# Patient Record
Sex: Female | Born: 2005 | Race: White | Hispanic: No | Marital: Single | Smoking: Never smoker
Health system: Southern US, Community
[De-identification: ages and names within clinical notes are randomized; demographics above are authoritative.]

## PROBLEM LIST (undated history)

## (undated) ENCOUNTER — Inpatient Hospital Stay (HOSPITAL_COMMUNITY): Payer: Self-pay

## (undated) ENCOUNTER — Emergency Department (HOSPITAL_COMMUNITY): Admission: EM

## (undated) DIAGNOSIS — F419 Anxiety disorder, unspecified: Secondary | ICD-10-CM

## (undated) DIAGNOSIS — O2340 Unspecified infection of urinary tract in pregnancy, unspecified trimester: Secondary | ICD-10-CM

## (undated) HISTORY — PX: NO PAST SURGERIES: SHX2092

---
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
## 2024-02-07 ENCOUNTER — Encounter (HOSPITAL_COMMUNITY): Payer: Self-pay | Admitting: Obstetrics and Gynecology

## 2024-02-07 ENCOUNTER — Inpatient Hospital Stay (HOSPITAL_COMMUNITY)
Admission: AD | Admit: 2024-02-07 | Discharge: 2024-02-07 | Disposition: A | Discharge status: Home / Self Care | Attending: Obstetrics and Gynecology | Admitting: Obstetrics and Gynecology

## 2024-02-07 ENCOUNTER — Inpatient Hospital Stay (HOSPITAL_COMMUNITY)

## 2024-02-07 DIAGNOSIS — Z362 Encounter for other antenatal screening follow-up: Secondary | ICD-10-CM | POA: Insufficient documentation

## 2024-02-07 DIAGNOSIS — R519 Headache, unspecified: Secondary | ICD-10-CM | POA: Diagnosis present

## 2024-02-07 DIAGNOSIS — R1030 Lower abdominal pain, unspecified: Secondary | ICD-10-CM | POA: Diagnosis present

## 2024-02-07 DIAGNOSIS — Z3A34 34 weeks gestation of pregnancy: Secondary | ICD-10-CM

## 2024-02-07 DIAGNOSIS — Z3493 Encounter for supervision of normal pregnancy, unspecified, third trimester: Secondary | ICD-10-CM

## 2024-02-07 DIAGNOSIS — Z113 Encounter for screening for infections with a predominantly sexual mode of transmission: Secondary | ICD-10-CM | POA: Diagnosis present

## 2024-02-07 DIAGNOSIS — O368130 Decreased fetal movements, third trimester, not applicable or unspecified: Secondary | ICD-10-CM | POA: Diagnosis not present

## 2024-02-07 LAB — URINALYSIS, ROUTINE W REFLEX MICROSCOPIC
Bilirubin Urine: NEGATIVE
Glucose, UA: NEGATIVE mg/dL
Hgb urine dipstick: NEGATIVE
Ketones, ur: NEGATIVE mg/dL
Leukocytes,Ua: NEGATIVE
Nitrite: NEGATIVE
Protein, ur: NEGATIVE mg/dL
Specific Gravity, Urine: 1.015 (ref 1.005–1.030)
pH: 7 (ref 5.0–8.0)

## 2024-02-07 LAB — WET PREP, GENITAL
Clue Cells Wet Prep HPF POC: NONE SEEN
Sperm: NONE SEEN
Trich, Wet Prep: NONE SEEN
WBC, Wet Prep HPF POC: <10 (ref <10)
Yeast Wet Prep HPF POC: NONE SEEN

## 2024-02-07 NOTE — Discharge Instructions (Signed)

## 2024-02-07 NOTE — MAU Note (Signed)
 Alexandria Gross is a 18 y.o. at [redacted]w[redacted]d here in MAU reporting: reports not feeling well today. Feeling cramping in lower abdomen that occasional stabbing pains. Denies any ctxs, LOF or VB. Reports not feeling any fetal movement last night and very minimal today. Reports FHR 180 with at home doppler. Feels nauseated and light headed for 2 days. Denies any vomiting or diarrhea.  Onset of complaint: 2 days  Vitals:   02/07/24 1327  BP: 122/75  Pulse: (!) 105  Resp: 17  SpO2: 97%     FHT:150 Lab orders placed from triage:  UA

## 2024-02-07 NOTE — MAU Provider Note (Signed)
 History     CSN: [id number]  Arrival date and time: 02/07/24 1313   Event Date/Time   First Provider Initiated Contact with Patient 02/07/24 1343      Chief Complaint  Patient presents with   Decreased Fetal Movement   Abdominal Pain   Headache   HPI  LONNETTA KNISKERN is a 18 y.o. G1P0 at [redacted]w[redacted]d who presents for evaluation of decreased fetal movement. Patient reports she has felt some movement today but it has been much less than normal and the last normal movement she felt was yesterday. She reports some vaginal discomfort but no pain. She denies any abdominal pain.   She denies any vaginal bleeding, discharge, and leaking of fluid. Denies any constipation, diarrhea or any urinary complaints.  OB History     Gravida  1   Para      Term      Preterm      AB      Living         SAB      IAB      Ectopic      Multiple      Live Births              Past Medical History:  Diagnosis Date   Anxiety    UTI (urinary tract infection) during pregnancy     Past Surgical History:  Procedure Laterality Date   NO PAST SURGERIES      Family History  Problem Relation Age of Onset   Cancer Maternal Grandfather    Diabetes Maternal Grandfather     Social History   Tobacco Use   Smoking status: Never    Passive exposure: Current   Smokeless tobacco: Never  Vaping Use   Vaping status: Never Used  Substance Use Topics   Alcohol use: No   Drug use: Never    Allergies: No Known Allergies  No medications prior to admission.    Review of Systems  Constitutional: Negative.  Negative for fatigue and fever.  HENT: Negative.    Respiratory: Negative.  Negative for shortness of breath.   Cardiovascular: Negative.  Negative for chest pain.  Gastrointestinal: Negative.  Negative for abdominal pain, constipation, diarrhea, nausea and vomiting.  Genitourinary: Negative.  Negative for dysuria.  Neurological: Negative.  Negative for dizziness and headaches.    Physical Exam   Blood pressure 126/77, pulse (!) 103, temperature 98.1 F (36.7 C), temperature source Oral, resp. rate 17, height 5' 3 (1.6 m), weight 60.3 kg, last menstrual period 04/18/2023, SpO2 97%.  Patient Vitals for the past 24 hrs:  BP Temp Temp src Pulse Resp SpO2 Height Weight  02/07/24 1331 126/77 98.1 F (36.7 C) Oral (!) 103 -- 97 % -- --  02/07/24 1327 122/75 -- -- (!) 105 17 97 % 5' 3 (1.6 m) 60.3 kg    Physical Exam Vitals and nursing note reviewed.  Constitutional:      General: She is not in acute distress.    Appearance: She is well-developed.  HENT:     Head: Normocephalic.  Eyes:     Pupils: Pupils are equal, round, and reactive to light.  Cardiovascular:     Rate and Rhythm: Normal rate and regular rhythm.     Heart sounds: Normal heart sounds.  Pulmonary:     Effort: Pulmonary effort is normal. No respiratory distress.     Breath sounds: Normal breath sounds.  Abdominal:     General: Bowel sounds  are normal. There is no distension.     Palpations: Abdomen is soft.     Tenderness: There is no abdominal tenderness.  Skin:    General: Skin is warm and dry.  Neurological:     Mental Status: She is alert and oriented to person, place, and time.  Psychiatric:        Mood and Affect: Mood normal.        Behavior: Behavior normal.        Thought Content: Thought content normal.        Judgment: Judgment normal.     Fetal Tracing:  Baseline: 140 Variability: moderate Accels: 15x15 Decels: none  Toco: none      MAU Course  Procedures  Results for orders placed or performed during the hospital encounter of 02/07/24 (from the past 24 hours)  Urinalysis, Routine w reflex microscopic -Urine, Clean Catch     Status: Abnormal   Collection Time: 02/07/24  1:29 PM  Result Value Ref Range   Color, Urine YELLOW YELLOW   APPearance HAZY (A) CLEAR   Specific Gravity, Urine 1.015 1.005 - 1.030   pH 7.0 5.0 - 8.0   Glucose, UA NEGATIVE NEGATIVE  mg/dL   Hgb urine dipstick NEGATIVE NEGATIVE   Bilirubin Urine NEGATIVE NEGATIVE   Ketones, ur NEGATIVE NEGATIVE mg/dL   Protein, ur NEGATIVE NEGATIVE mg/dL   Nitrite NEGATIVE NEGATIVE   Leukocytes,Ua NEGATIVE NEGATIVE  Wet prep, genital     Status: None   Collection Time: 02/07/24  2:17 PM   Specimen: Vaginal  Result Value Ref Range   Yeast Wet Prep HPF POC NONE SEEN NONE SEEN   Trich, Wet Prep NONE SEEN NONE SEEN   Clue Cells Wet Prep HPF POC NONE SEEN NONE SEEN   WBC, Wet Prep HPF POC <10 <10   Sperm NONE SEEN        MDM Prenatal records from community office reviewed.  Labs ordered and reviewed.   UA Wet prep/gc/chlamydia US  MFM BPP- 8/8  Reassurance provided of normalcy of exam and ultrasound. Patient inquired about fetal weight. States the office told her yesterday that the baby was measuring small and she needed repeat growth ultrasound, but they didn't have any appointment available. Encouraged patient to call Monday to scheduled repeat u/s in office as weight is not routinely done in MAU.   Assessment and Plan   1. Movement of fetus present during pregnancy in third trimester   2. [redacted] weeks gestation of pregnancy     -Discharge home in stable condition -Third trimester precautions discussed -Patient advised to follow-up with OB as scheduled for prenatal care -Patient may return to MAU as needed or if her condition were to change or worsen  Aleck CHRISTELLA Fireman, CNM 02/07/2024, 1:43 PM

## 2024-02-09 LAB — GC/CHLAMYDIA PROBE AMP (~~LOC~~) NOT AT ARMC
Chlamydia: NEGATIVE
Comment: NEGATIVE
Comment: NORMAL
Neisseria Gonorrhea: NEGATIVE

## 2024-03-10 LAB — OB RESULTS CONSOLE GBS: GBS: NEGATIVE

## 2024-03-19 ENCOUNTER — Other Ambulatory Visit: Payer: Self-pay

## 2024-03-19 ENCOUNTER — Inpatient Hospital Stay (HOSPITAL_COMMUNITY): Admitting: Anesthesiology

## 2024-03-19 ENCOUNTER — Encounter (HOSPITAL_COMMUNITY): Payer: Self-pay | Admitting: Family Medicine

## 2024-03-19 ENCOUNTER — Inpatient Hospital Stay (HOSPITAL_COMMUNITY)
Admission: AD | Admit: 2024-03-19 | Discharge: 2024-03-21 | DRG: 806 | Disposition: A | Discharge status: Home / Self Care | Attending: Obstetrics and Gynecology | Admitting: Obstetrics and Gynecology

## 2024-03-19 DIAGNOSIS — D509 Iron deficiency anemia, unspecified: Secondary | ICD-10-CM | POA: Diagnosis present

## 2024-03-19 DIAGNOSIS — O2662 Liver and biliary tract disorders in childbirth: Secondary | ICD-10-CM | POA: Diagnosis present

## 2024-03-19 DIAGNOSIS — K8050 Calculus of bile duct without cholangitis or cholecystitis without obstruction: Secondary | ICD-10-CM

## 2024-03-19 DIAGNOSIS — O26643 Intrahepatic cholestasis of pregnancy, third trimester: Secondary | ICD-10-CM | POA: Diagnosis present

## 2024-03-19 DIAGNOSIS — Z3A40 40 weeks gestation of pregnancy: Secondary | ICD-10-CM

## 2024-03-19 DIAGNOSIS — Z833 Family history of diabetes mellitus: Secondary | ICD-10-CM | POA: Diagnosis not present

## 2024-03-19 DIAGNOSIS — O9902 Anemia complicating childbirth: Principal | ICD-10-CM | POA: Diagnosis present

## 2024-03-19 DIAGNOSIS — O26893 Other specified pregnancy related conditions, third trimester: Secondary | ICD-10-CM | POA: Diagnosis present

## 2024-03-19 DIAGNOSIS — R748 Abnormal levels of other serum enzymes: Secondary | ICD-10-CM | POA: Diagnosis not present

## 2024-03-19 DIAGNOSIS — K838 Other specified diseases of biliary tract: Secondary | ICD-10-CM | POA: Diagnosis not present

## 2024-03-19 DIAGNOSIS — O26649 Intrahepatic cholestasis of pregnancy, unspecified trimester: Secondary | ICD-10-CM | POA: Diagnosis present

## 2024-03-19 DIAGNOSIS — K8020 Calculus of gallbladder without cholecystitis without obstruction: Secondary | ICD-10-CM | POA: Diagnosis present

## 2024-03-19 LAB — CBC
HCT: 27.4 % — ABNORMAL LOW (ref 36.0–46.0)
Hemoglobin: 8.4 g/dL — ABNORMAL LOW (ref 12.0–15.0)
MCH: 25.2 pg — ABNORMAL LOW (ref 26.0–34.0)
MCHC: 30.7 g/dL (ref 30.0–36.0)
MCV: 82.3 fL (ref 80.0–100.0)
Platelets: 347 K/uL (ref 150–400)
RBC: 3.33 MIL/uL — ABNORMAL LOW (ref 3.87–5.11)
RDW: 15.6 % — ABNORMAL HIGH (ref 11.5–15.5)
WBC: 10.1 K/uL (ref 4.0–10.5)
nRBC: 0 % (ref 0.0–0.2)

## 2024-03-19 LAB — RPR: RPR Ser Ql: NONREACTIVE

## 2024-03-19 LAB — TYPE AND SCREEN
ABO/RH(D): A POS
Antibody Screen: NEGATIVE

## 2024-03-19 MED ORDER — PRENATAL MULTIVITAMIN CH
1.0000 | ORAL_TABLET | Freq: Every day | ORAL | Status: DC
  Administered 2024-03-20: 1 via ORAL
  Filled 2024-03-19: qty 1

## 2024-03-19 MED ORDER — FENTANYL-BUPIVACAINE-NACL 0.5-0.125-0.9 MG/250ML-% EP SOLN: 12.0000 mL/h | EPIDURAL | Status: DC | PRN

## 2024-03-19 MED ORDER — EPHEDRINE 5 MG/ML INJ: 10.0000 mg | INTRAVENOUS | Status: DC | PRN

## 2024-03-19 MED ORDER — DIPHENHYDRAMINE HCL 25 MG PO CAPS: 25.0000 mg | ORAL_CAPSULE | Freq: Four times a day (QID) | ORAL | Status: DC | PRN

## 2024-03-19 MED ORDER — ONDANSETRON HCL 4 MG/2ML IJ SOLN: 4.0000 mg | INTRAMUSCULAR | Status: DC | PRN

## 2024-03-19 MED ORDER — ONDANSETRON HCL 4 MG PO TABS
4.0000 mg | ORAL_TABLET | ORAL | Status: DC | PRN
  Administered 2024-03-19: 4 mg via ORAL
  Filled 2024-03-19: qty 1

## 2024-03-19 MED ORDER — LIDOCAINE HCL (PF) 1 % IJ SOLN
INTRAMUSCULAR | Status: DC | PRN
  Administered 2024-03-19: 8 mL via EPIDURAL

## 2024-03-19 MED ORDER — LACTATED RINGERS IV SOLN: 500.0000 mL | Freq: Once | INTRAVENOUS | Status: DC

## 2024-03-19 MED ORDER — ZOLPIDEM TARTRATE 5 MG PO TABS: 5.0000 mg | ORAL_TABLET | Freq: Every evening | ORAL | Status: DC | PRN

## 2024-03-19 MED ORDER — PHENYLEPHRINE 80 MCG/ML (10ML) SYRINGE FOR IV PUSH (FOR BLOOD PRESSURE SUPPORT): 80.0000 ug | PREFILLED_SYRINGE | INTRAVENOUS | Status: DC | PRN

## 2024-03-19 MED ORDER — TETANUS-DIPHTH-ACELL PERTUSSIS 5-2-15.5 LF-MCG/0.5 IM SUSP: 0.5000 mL | Freq: Once | INTRAMUSCULAR | Status: DC

## 2024-03-19 MED ORDER — COCONUT OIL OIL
1.0000 | TOPICAL_OIL | Status: DC | PRN
  Administered 2024-03-21: 1 via TOPICAL

## 2024-03-19 MED ORDER — SOD CITRATE-CITRIC ACID 500-334 MG/5ML PO SOLN: 30.0000 mL | ORAL | Status: DC | PRN

## 2024-03-19 MED ORDER — SIMETHICONE 80 MG PO CHEW: 80.0000 mg | CHEWABLE_TABLET | ORAL | Status: DC | PRN

## 2024-03-19 MED ORDER — OXYTOCIN BOLUS FROM INFUSION
333.0000 mL | Freq: Once | INTRAVENOUS | Status: AC
  Administered 2024-03-19: 333 mL via INTRAVENOUS

## 2024-03-19 MED ORDER — OXYCODONE-ACETAMINOPHEN 5-325 MG PO TABS: 2.0000 | ORAL_TABLET | ORAL | Status: DC | PRN

## 2024-03-19 MED ORDER — LIDOCAINE HCL (PF) 1 % IJ SOLN: 30.0000 mL | INTRAMUSCULAR | Status: DC | PRN

## 2024-03-19 MED ORDER — BENZOCAINE-MENTHOL 20-0.5 % EX AERO
1.0000 | INHALATION_SPRAY | CUTANEOUS | Status: DC | PRN
  Administered 2024-03-19: 1 via TOPICAL
  Filled 2024-03-19: qty 56

## 2024-03-19 MED ORDER — SENNOSIDES-DOCUSATE SODIUM 8.6-50 MG PO TABS
2.0000 | ORAL_TABLET | Freq: Every day | ORAL | Status: DC
  Administered 2024-03-20: 2 via ORAL
  Filled 2024-03-19: qty 2

## 2024-03-19 MED ORDER — OXYTOCIN-SODIUM CHLORIDE 30-0.9 UT/500ML-% IV SOLN
1.0000 m[IU]/min | INTRAVENOUS | Status: DC
  Administered 2024-03-19: 2 m[IU]/min via INTRAVENOUS
  Filled 2024-03-19: qty 500

## 2024-03-19 MED ORDER — ACETAMINOPHEN 325 MG PO TABS: 650.0000 mg | ORAL_TABLET | ORAL | Status: DC | PRN

## 2024-03-19 MED ORDER — ONDANSETRON HCL 4 MG/2ML IJ SOLN: 4.0000 mg | Freq: Four times a day (QID) | INTRAMUSCULAR | Status: DC | PRN

## 2024-03-19 MED ORDER — LACTATED RINGERS IV SOLN
500.0000 mL | INTRAVENOUS | Status: DC | PRN
  Administered 2024-03-19: 1000 mL via INTRAVENOUS

## 2024-03-19 MED ORDER — FLEET ENEMA RE ENEM: 1.0000 | ENEMA | RECTAL | Status: DC | PRN

## 2024-03-19 MED ORDER — DIPHENHYDRAMINE HCL 50 MG/ML IJ SOLN: 12.5000 mg | INTRAMUSCULAR | Status: DC | PRN

## 2024-03-19 MED ORDER — LACTATED RINGERS IV SOLN
500.0000 mL | Freq: Once | INTRAVENOUS | Status: AC
  Administered 2024-03-19: 500 mL via INTRAVENOUS

## 2024-03-19 MED ORDER — LACTATED RINGERS IV SOLN: INTRAVENOUS | Status: DC

## 2024-03-19 MED ORDER — OXYTOCIN-SODIUM CHLORIDE 30-0.9 UT/500ML-% IV SOLN: 2.5000 [IU]/h | INTRAVENOUS | Status: DC

## 2024-03-19 MED ORDER — FENTANYL-BUPIVACAINE-NACL 0.5-0.125-0.9 MG/250ML-% EP SOLN
EPIDURAL | Status: AC
  Filled 2024-03-19: qty 250

## 2024-03-19 MED ORDER — WITCH HAZEL-GLYCERIN EX PADS: 1.0000 | MEDICATED_PAD | CUTANEOUS | Status: DC | PRN

## 2024-03-19 MED ORDER — IBUPROFEN 600 MG PO TABS
600.0000 mg | ORAL_TABLET | Freq: Four times a day (QID) | ORAL | Status: DC
  Administered 2024-03-19 – 2024-03-21 (×6): 600 mg via ORAL
  Filled 2024-03-19 (×6): qty 1

## 2024-03-19 MED ORDER — TERBUTALINE SULFATE 1 MG/ML IJ SOLN: 0.2500 mg | Freq: Once | INTRAMUSCULAR | Status: DC | PRN

## 2024-03-19 MED ORDER — FENTANYL-BUPIVACAINE-NACL 0.5-0.125-0.9 MG/250ML-% EP SOLN
12.0000 mL/h | EPIDURAL | Status: DC | PRN
  Administered 2024-03-19: 12 mL/h via EPIDURAL

## 2024-03-19 MED ORDER — DIBUCAINE (PERIANAL) 1 % EX OINT: 1.0000 | TOPICAL_OINTMENT | CUTANEOUS | Status: DC | PRN

## 2024-03-19 MED ORDER — OXYCODONE-ACETAMINOPHEN 5-325 MG PO TABS: 1.0000 | ORAL_TABLET | ORAL | Status: DC | PRN

## 2024-03-19 NOTE — Anesthesia Procedure Notes (Signed)
 Epidural Patient location during procedure: OB Start time: 03/19/2024 8:15 AM End time: 03/19/2024 8:20 AM  Staffing Anesthesiologist: Dorethea Cordella SQUIBB, DO  Preanesthetic Checklist Completed: patient identified, IV checked, site marked, risks and benefits discussed, surgical consent, monitors and equipment checked, pre-op evaluation and timeout performed  Epidural Patient position: sitting Prep: DuraPrep and site prepped and draped Patient monitoring: continuous pulse ox and blood pressure Location: L4-L5 Injection technique: LOR air  Needle:  Needle type: Tuohy  Needle gauge: 17 G Needle length: 9 cm and 9 Needle insertion depth: 4 cm Catheter type: closed end flexible Catheter size: 19 Gauge Catheter at skin depth: 9 cm Test dose: negative  Assessment Events: blood not aspirated, no cerebrospinal fluid, injection not painful, no injection resistance, no paresthesia and negative IV test

## 2024-03-19 NOTE — H&P (Signed)
 OB ADMISSION/ HISTORY & PHYSICAL:  Admission Date: 03/19/2024 12:57 AM  Admit Diagnosis: Indication for care in labor or delivery [O75.9]    Alexandria Gross is a 18 y.o. female G1P0 at 109w1d presenting for contractions. Reports contractions every 2-3 minutes for the last hour with bloody show. Denies leaking of fluid. Endorses + fetal movement.   Prenatal History: G1P0   EDC: 03/18/2024  Prenatal course complicated by: Teen pregnancy Asymptomatic bacteriuria, treated, negative TOC at 32 weeks BMI 18 at NOB, AGA growth  Prenatal Labs: ABO, Rh:   A POS Antibody: NEG (11/07 0245) Rubella: Immune (04/03 0000)  RPR: Nonreactive (04/03 0000)  HBsAg: Negative (04/03 0000)  HIV: Non-reactive (04/03 0000)  GBS: Negative/-- (10/29 0000)  1 hr Glucola : 130 Genetic Screening: Low risk Panorama Ultrasound: normal anatomy, anterior placenta, EFW 6lb 4oz, 45% at [redacted]w[redacted]d    Maternal Diabetes: No Genetic Screening: Normal Maternal Ultrasounds/Referrals: Normal Fetal Ultrasounds or other Referrals:  None Maternal Substance Abuse:  No Significant Maternal Medications:  None Significant Maternal Lab Results:  Group B Strep negative Other Comments:  None  Medical / Surgical History : Past medical history:  Past Medical History:  Diagnosis Date   Anxiety    UTI (urinary tract infection) during pregnancy     Past surgical history:  Past Surgical History:  Procedure Laterality Date   NO PAST SURGERIES      Family History:  Family History  Problem Relation Age of Onset   Cancer Maternal Grandfather    Diabetes Maternal Grandfather     Social History:  reports that she has never smoked. She has been exposed to tobacco smoke. She has never used smokeless tobacco. She reports that she does not drink alcohol and does not use drugs.  Allergies: Patient has no known allergies.   Current Medications at time of admission:  Medications Prior to Admission  Medication Sig Dispense Refill  Last Dose/Taking   acetaminophen  (TYLENOL ) 500 MG tablet Take 2 tablets (1,000 mg total) by mouth every 6 (six) hours as needed for moderate pain (pain score 4-6) or mild pain (pain score 1-3) (for pain scale < 4  OR  temperature  >/=  100.5 F). 30 tablet 0 Past Month   folic acid (FOLVITE) 1 MG tablet Take 1 mg by mouth daily.   03/18/2024   ondansetron  (ZOFRAN -ODT) 4 MG disintegrating tablet Take 1 tablet (4 mg total) by mouth every 6 (six) hours as needed for nausea. 20 tablet 0 Past Week   Prenatal Vit-Fe Fumarate-FA (PRENATAL MULTIVITAMIN) TABS tablet Take 1 tablet by mouth daily at 12 noon.   03/18/2024   Cholecalciferol (VITAMIN D3) 50 MCG (2000 UT) capsule Take 4,000 Units by mouth daily.      cyclobenzaprine  (FLEXERIL ) 5 MG tablet Take 1 tablet (5 mg total) by mouth every 8 (eight) hours as needed. (Patient not taking: Reported on 03/19/2024) 30 tablet 0 Not Taking   DICLEGIS 10-10 MG TBEC Take 2 tablets by mouth at bedtime. (Patient not taking: Reported on 03/19/2024)   Not Taking   fluconazole  (DIFLUCAN ) 150 MG tablet Take 1 tablet (150 mg total) by mouth daily. 1 tablet 0    NIFEdipine  (PROCARDIA ) 10 MG capsule Take 1 capsule (10 mg total) by mouth every 6 (six) hours as needed (contractions). 20 capsule 0    oxyCODONE  (OXY IR/ROXICODONE ) 5 MG immediate release tablet Take 1 tablet (5 mg total) by mouth every 6 (six) hours as needed for breakthrough pain or severe  pain (pain score 7-10). 30 tablet 0    Prenatal Vit-Fe Fumarate-FA (PRENATAL MULTIVITAMIN) TABS tablet Take 1 tablet by mouth daily at 12 noon. 90 tablet 1     Review of Systems: Review of Systems  All other systems reviewed and are negative.  Physical Exam: Vital signs and nursing notes reviewed.  Patient Vitals for the past 24 hrs:  BP Temp Temp src Pulse Resp SpO2 Height Weight  03/19/24 0501 118/76 98 F (36.7 C) Oral (!) 105 16 -- -- --  03/19/24 0126 119/75 -- -- 89 -- 97 % -- --  03/19/24 0113 119/77 -- -- -- -- --  -- --  03/19/24 0110 -- 97.8 F (36.6 C) -- 73 17 99 % 5' 3 (1.6 m) 64.9 kg    General: AAO x 3, NAD Heart: RRR Lungs:CTAB Abdomen: Gravid, NT Extremities: no edema SVE: Dilation: 3  Effacement (%): 80 Cervical Position: Posterior Station: -2 Presentation: Vertex Exam by:: Idell Gunning, RN   FHR: 130BPM, moderate variability, + accels, no decels TOCO: Contractions q 2-4 minutes  Labs:   Recent Labs    03/19/24 0245  WBC 10.1  HGB 8.4*  HCT 27.4*  PLT 347   Assessment/Plan: 18 y.o. G1P0 at [redacted]w[redacted]d, early labor Anemia, admission Hgb 8.4  Fetal wellbeing - FHT category 1 EFW AGA 7-8lbs  Labor: Expectant management, AROM and Pitocin for augmentation prn  GBS negative Rubella immune Rh positive  Pain control: desires epidural Analgesia/anesthesia PRN  Anticipated MOD: NSVD  Plans to breastfeed. POC discussed with patient and support team, all questions answered.  Dr. Letha notified of admission/plan of care.  Alan MARLA Molt CNM, MSN 03/19/2024, 6:49 AM

## 2024-03-19 NOTE — Lactation Note (Signed)
 This note was copied from a baby's chart. Lactation Consultation Note  Patient Name: Alexandria Gross Unijb'd Date: 03/19/2024 Age:19 hours Reason for consult: Initial assessment;1st time breastfeeding;Term  P1, term female infant. Per MOB, infant did not latch in L&D. LC gave MOB pillow support and talked with MOB how to latch infant at the breast. MOB latched infant on her left breast using the cross cradle hold, infant was on and off the breast and was still breastfeeding after 6 minutes when LC left the room. LC used breast model and MOB expressed 4 mls of colostrum that was spoon fed to infant prior to latching at the breast. MOB will continue to work towards latching infant at the breast and feed infant by cues, on demand, 8-12 times within 24 hours, skin to skin. MOB knows if infant does not latch at the breast she can hand express and give infant back colostrum by spoon on Day 1 of life. LC discussed the importance of maternal rest, meals and hydration. MOB was made aware of O/P services, breastfeeding support groups, community resources, and our phone # for post-discharge questions.    MOB does have DEBP at home.   Maternal Data Has patient been taught Hand Expression?: Yes Does the patient have breastfeeding experience prior to this delivery?: No  Feeding Mother's Current Feeding Choice: Breast Milk Nipple Type: Nfant Extra Slow Flow (gold)  LATCH Score Latch: Repeated attempts needed to sustain latch, nipple held in mouth throughout feeding, stimulation needed to elicit sucking reflex.  Audible Swallowing: A few with stimulation  Type of Nipple: Everted at rest and after stimulation  Comfort (Breast/Nipple): Soft / non-tender  Hold (Positioning): No assistance needed to correctly position infant at breast.  LATCH Score: 8   Lactation Tools Discussed/Used    Interventions Interventions: Breast feeding basics reviewed;Skin to skin;Breast compression;Adjust  position;Support pillows;Position options;Expressed milk;Hand express;DEBP;Education;Guidelines for Milk Supply and Pumping Schedule Handout;CDC milk storage guidelines;CDC Guidelines for Breast Pump Cleaning;LC Services brochure  Discharge Pump: Personal;DEBP  Consult Status Consult Status: Follow-up Date: 03/20/24 Follow-up type: In-patient    [name] 03/19/2024, 6:32 PM

## 2024-03-19 NOTE — Anesthesia Preprocedure Evaluation (Signed)
 Anesthesia Evaluation  Patient identified by MRN, date of birth, ID band Patient awake    Reviewed: Allergy & Precautions, H&P , NPO status , Patient's Chart, lab work & pertinent test results, reviewed documented beta blocker date and time   Airway Mallampati: II  TM Distance: >3 FB Neck ROM: full    Dental no notable dental hx.    Pulmonary neg pulmonary ROS   Pulmonary exam normal breath sounds clear to auscultation       Cardiovascular negative cardio ROS Normal cardiovascular exam Rhythm:regular Rate:Normal     Neuro/Psych   Anxiety     negative neurological ROS  negative psych ROS   GI/Hepatic negative GI ROS, Neg liver ROS,,,  Endo/Other  negative endocrine ROS    Renal/GU negative Renal ROS  negative genitourinary   Musculoskeletal   Abdominal   Peds  Hematology negative hematology ROS (+)   Anesthesia Other Findings   Reproductive/Obstetrics (+) Pregnancy                              Anesthesia Physical Anesthesia Plan  ASA: 2  Anesthesia Plan: Epidural   Post-op Pain Management:    Induction:   PONV Risk Score and Plan:   Airway Management Planned:   Additional Equipment: None  Intra-op Plan:   Post-operative Plan:   Informed Consent: I have reviewed the patients History and Physical, chart, labs and discussed the procedure including the risks, benefits and alternatives for the proposed anesthesia with the patient or authorized representative who has indicated his/her understanding and acceptance.       Plan Discussed with: Anesthesiologist  Anesthesia Plan Comments:         Anesthesia Quick Evaluation

## 2024-03-19 NOTE — Progress Notes (Signed)
 S: Comfortable with epidural. Consented for AROM. Family at the bedside.  O: Vitals:   03/19/24 0845 03/19/24 0901 03/19/24 0922 03/19/24 0930  BP: (!) 108/57 120/76  (!) 128/53  Pulse: 85 88  80  Resp:  14    Temp:   97.6 F (36.4 C)   TempSrc:   Oral   SpO2: 96%     Weight:      Height:       FHT:  FHR: 130 bpm, variability: moderate,  accelerations:  Present,  decelerations:  Absent UC:   regular, every 3-5 minutes SVE:   Dilation: 3.5 Effacement (%): 70 Station: -2 Exam by:: ASABRA Molt, CNM  AROM of a small amount of clear fluid at 0856.   A / P: Spontaneous labor, progressing normally  Fetal Wellbeing:  Category I GBS: Negative Pain Control:  Epidural Anticipated MOD:  NSVD  Contractions spaced out post epidural. Discussed starting Pitocin 2x2 and patient consents to start Pitocin for augmentation.   Alan MARLA Molt, CNM, MSN 03/19/2024, 10:14 AM

## 2024-03-19 NOTE — MAU Note (Signed)
 Alexandria Gross is a 18 y.o. at [redacted]w[redacted]d here in MAU reporting ctxs for the past 4-5 hours that have gotten closer and stronger the last 2 hours. Reports good FM and denies LOF. Some bloody show. No recent sve  LMP: na Onset of complaint: 5hrs ago Pain score: 8 Vitals:   03/19/24 0110 03/19/24 0113  BP:  119/77  Pulse: 73   Resp: 17   Temp: 97.8 F (36.6 C)   SpO2: 99%      FHT: 135  Lab orders placed from triage: labor eval

## 2024-03-20 ENCOUNTER — Inpatient Hospital Stay (HOSPITAL_COMMUNITY)

## 2024-03-20 DIAGNOSIS — O26649 Intrahepatic cholestasis of pregnancy, unspecified trimester: Secondary | ICD-10-CM | POA: Diagnosis present

## 2024-03-20 DIAGNOSIS — R748 Abnormal levels of other serum enzymes: Secondary | ICD-10-CM

## 2024-03-20 LAB — HEPATIC FUNCTION PANEL
ALT: 445 U/L — ABNORMAL HIGH (ref 0–44)
AST: 265 U/L — ABNORMAL HIGH (ref 15–41)
Albumin: 2.2 g/dL — ABNORMAL LOW (ref 3.5–5.0)
Alkaline Phosphatase: 157 U/L — ABNORMAL HIGH (ref 38–126)
Bilirubin, Direct: 0.2 mg/dL (ref 0.0–0.2)
Indirect Bilirubin: 0.6 mg/dL (ref 0.3–0.9)
Total Bilirubin: 0.8 mg/dL (ref 0.0–1.2)
Total Protein: 6.1 g/dL — ABNORMAL LOW (ref 6.5–8.1)

## 2024-03-20 LAB — CBC
HCT: 23.9 % — ABNORMAL LOW (ref 36.0–46.0)
Hemoglobin: 7.4 g/dL — ABNORMAL LOW (ref 12.0–15.0)
MCH: 25.2 pg — ABNORMAL LOW (ref 26.0–34.0)
MCHC: 31 g/dL (ref 30.0–36.0)
MCV: 81.3 fL (ref 80.0–100.0)
Platelets: 284 K/uL (ref 150–400)
RBC: 2.94 MIL/uL — ABNORMAL LOW (ref 3.87–5.11)
RDW: 15.9 % — ABNORMAL HIGH (ref 11.5–15.5)
WBC: 10.9 K/uL — ABNORMAL HIGH (ref 4.0–10.5)
nRBC: 0 % (ref 0.0–0.2)

## 2024-03-20 LAB — HEPATITIS PANEL, ACUTE
HCV Ab: NONREACTIVE
HCV Ab: NONREACTIVE
Hep A IgM: NONREACTIVE
Hep A IgM: NONREACTIVE
Hep B C IgM: NONREACTIVE
Hep B C IgM: NONREACTIVE
Hepatitis B Surface Ag: NONREACTIVE
Hepatitis B Surface Ag: NONREACTIVE

## 2024-03-20 LAB — COMPREHENSIVE METABOLIC PANEL WITH GFR
ALT: 442 U/L — ABNORMAL HIGH (ref 0–44)
AST: 267 U/L — ABNORMAL HIGH (ref 15–41)
Albumin: 2 g/dL — ABNORMAL LOW (ref 3.5–5.0)
Alkaline Phosphatase: 144 U/L — ABNORMAL HIGH (ref 38–126)
Anion gap: 8 (ref 5–15)
BUN: 6 mg/dL (ref 6–20)
CO2: 24 mmol/L (ref 22–32)
Calcium: 8.4 mg/dL — ABNORMAL LOW (ref 8.9–10.3)
Chloride: 102 mmol/L (ref 98–111)
Creatinine, Ser: 0.51 mg/dL (ref 0.44–1.00)
GFR, Estimated: >60 mL/min (ref >60)
Glucose, Bld: 75 mg/dL (ref 70–99)
Potassium: 3.9 mmol/L (ref 3.5–5.1)
Sodium: 134 mmol/L — ABNORMAL LOW (ref 135–145)
Total Bilirubin: 0.5 mg/dL (ref 0.0–1.2)
Total Protein: 5.6 g/dL — ABNORMAL LOW (ref 6.5–8.1)

## 2024-03-20 LAB — LACTATE DEHYDROGENASE: LDH: 206 U/L — ABNORMAL HIGH (ref 98–192)

## 2024-03-20 MED ORDER — ALBUTEROL SULFATE (2.5 MG/3ML) 0.083% IN NEBU: 2.5000 mg | INHALATION_SOLUTION | Freq: Once | RESPIRATORY_TRACT | Status: DC | PRN

## 2024-03-20 MED ORDER — SODIUM CHLORIDE 0.9 % IV SOLN
500.0000 mg | Freq: Once | INTRAVENOUS | Status: AC
  Administered 2024-03-20: 500 mg via INTRAVENOUS
  Filled 2024-03-20: qty 25

## 2024-03-20 MED ORDER — METHYLPREDNISOLONE SODIUM SUCC 125 MG IJ SOLR: 125.0000 mg | Freq: Once | INTRAMUSCULAR | Status: DC | PRN

## 2024-03-20 MED ORDER — SODIUM CHLORIDE 0.9 % IV SOLN: INTRAVENOUS | Status: AC | PRN

## 2024-03-20 MED ORDER — DIPHENHYDRAMINE HCL 50 MG/ML IJ SOLN: 25.0000 mg | Freq: Once | INTRAMUSCULAR | Status: DC | PRN

## 2024-03-20 MED ORDER — EPINEPHRINE 0.3 MG/0.3ML IJ SOAJ: 0.3000 mg | Freq: Once | INTRAMUSCULAR | Status: DC | PRN

## 2024-03-20 MED ORDER — SODIUM CHLORIDE 0.9 % IV BOLUS: 500.0000 mL | Freq: Once | INTRAVENOUS | Status: DC | PRN

## 2024-03-20 NOTE — Anesthesia Postprocedure Evaluation (Signed)
 Anesthesia Post Note  Patient: Alexandria Gross  Procedure(s) Performed: AN AD HOC LABOR EPIDURAL     Patient location during evaluation: Mother Baby Anesthesia Type: Epidural Level of consciousness: awake and alert Pain management: pain level controlled Vital Signs Assessment: post-procedure vital signs reviewed and stable Respiratory status: spontaneous breathing, nonlabored ventilation and respiratory function stable Cardiovascular status: stable Postop Assessment: no headache, no backache and epidural receding Anesthetic complications: no   No notable events documented.  Last Vitals:  Vitals:   03/20/24 0009 03/20/24 0530  BP: 104/60 94/60  Pulse: 77 84  Resp: 18 18  Temp: 36.6 C 36.9 C  SpO2: 97% 97%    Last Pain:  Vitals:   03/20/24 0531  TempSrc:   PainSc: 0-No pain   Pain Goal: Patients Stated Pain Goal: 0 (03/19/24 0114)                 Yaslin Kirtley

## 2024-03-20 NOTE — Progress Notes (Signed)
 Dr. Letha was notified of patient's new lab results of AST 267 and ALT 442.  She stated she would look at them and determine what the plan of care should be and would update this RN.  Yogi Arther N

## 2024-03-20 NOTE — Lactation Note (Signed)
 This note was copied from a baby's chart. Lactation Consultation Note  Patient Name: Alexandria Gross Date: 03/20/2024 Age:19 hours Reason for consult: 1st time breastfeeding;Follow-up assessment;Exclusive pumping and bottle feeding (questionable weight loss -8.51% within first 24 hours of life.)  P1, term female infant, MOB informed LC she wants to pump only and formula feed infant. LC set up DEBP and MOB was fitted with 18 mm breast flange. MOB does have DEBP at home. MOB will continue to pump every 3 hours for 15 minutes and give infant back any EBM first before offering formula. MOB knows on Day 2 of life infant may consume 15-30 mls of EBM/Formula. MOB has handout  Breast Milk Storage and Collection . MOB knows that her EBM is safe for 4 hours whereas formula RTF is only safe for 1 hour. MOB knows to call [facility] services if she has any breastfeeding questions or concerns.   Maternal Data    Feeding Mother's Current Feeding Choice: Breast Milk and Formula Nipple Type: Slow - flow  LATCH Score  MOB current feeding choice is pumping only and formula feeding infant.                   Lactation Tools Discussed/Used Tools: Pump;Flanges Flange Size: 18 Breast pump type: Double-Electric Breast Pump Pump Education: Setup, frequency, and cleaning;Milk Storage Reason for Pumping: MOB decided to pump only and formula feed infant. Pumping frequency: MOB plans to pump every 3 hours for 15 minutes on inital setting.  Interventions Interventions: CDC Guidelines for Breast Pump Cleaning;CDC milk storage guidelines;LC Services brochure;Guidelines for Milk Supply and Pumping Schedule Handout;Pace feeding;Education  Discharge Pump: Personal;DEBP  Consult Status Consult Status: Follow-up Date: 03/21/24 Follow-up type: In-patient    Alexandria Gross 03/20/2024, 10:38 PM

## 2024-03-20 NOTE — Progress Notes (Signed)
 Note from Orie Bonus, CNM: Just received outpatient lab results from testing for cholestasis following office visit 11/5. Patient reported new onset itching of hands and feet over the last few days.  Bile acids and CMP ordered, patient went to draw station for labs (due to office lab unavailable).  Results received in my EMR box today Bile acids 35.4, AST 144, ALT 214. Dr. Letha notified as on-call provider today for [facility].She will determine f/u plan and notify the patient.

## 2024-03-20 NOTE — Progress Notes (Addendum)
 CSW received consult for hx of Anxiety. CSW met with MOB to offer support and complete assessment. When CSW entered the room, MOB was observed laying in hospital bed. MOB's mother and FOB were present. MOB's mother was holding infant. CSW introduced self and requested to speak with MOB alone. Visitors left room. CSW explained reason for visit. MOB presented as quiet but pleasant and was observed smiling. MOB was agreeable to CSW visit.  CSW inquired how MOB is feeling emotionally since infant's arrival. MOB shared she is feeling a little nervous but overall well. MOB shared feeling nervous about caring for infant as a first time mom. MOB reports having good support, marked by FOB and her parents.   CSW collected mental health history. MOB acknowledged a history of anxiety, reporting she was initially diagnosed about 7 years ago. MOB denied mental health symptoms during pregnancy. MOB reports she is not current with a therapist or prescribed mental health medication and declined outpatient mental health resources at this time. MOB denied current SI/HI/DV.  CSW provided education regarding the baby blues period vs. perinatal mood disorders, discussed treatment and gave resources for mental health follow up if concerns arise.  CSW recommends self-evaluation during the postpartum time period using the New Mom Checklist from Postpartum Progress and encouraged MOB to contact a medical professional if symptoms are noted at any time.    MOB reports she has all needed items for infant, including a car seat and bassinet. MOB is undecided on a pediatrician for infant, CSW provided a list.   CSW provided review of Sudden Infant Death Syndrome (SIDS) precautions.    CSW identifies no further need for intervention and no barriers to discharge at this time.  Signed,  Sharyne LOIS Roulette, MSW, LCSWA, LCASA March 18, 2024 3:37 PM

## 2024-03-20 NOTE — Progress Notes (Addendum)
 POSTPARTUM PROGRESS NOTE  Post Partum Day 1  Subjective:  Alexandria Gross is a 18 y.o. G1P1001 s/p VD at 103w1d.  She reports she is doing well. No acute events overnight. She denies any problems with ambulating, voiding or po intake. Denies nausea or vomiting.  Pain is well controlled.  Lochia is minimal.  Objective: Blood pressure 94/60, pulse 84, temperature 98.4 F (36.9 C), temperature source Oral, resp. rate 18, height 5' 3 (1.6 m), weight 64.9 kg, last menstrual period 04/18/2023, SpO2 97%, unknown if currently breastfeeding.  Physical Exam:  General: alert, cooperative and no distress Chest: no respiratory distress Heart:regular rate, distal pulses intact Abdomen: soft, nontender,  Uterine Fundus: firm, appropriately tender DVT Evaluation: No calf swelling or tenderness Extremities: No LE edema Skin: warm, dry  Recent Labs    03/19/24 0245 03/20/24 0514  HGB 8.4* 7.4*  HCT 27.4* 23.9*    Assessment/Plan: Alexandria Gross is a 18 y.o. G1P1001 s/p VD at [redacted]w[redacted]d   PPD#1 - Doing well  Routine postpartum care Anemia- asymptomatic but had a chronic hx of constipation that may be worsened by oral iron. Will give venofer x1.  ICP- per CNM note today. 11/5-Bile acids 35.4, AST 144, ALT 214. Follow up CMP and repeat LFTs and bile acids 6 weeks PP.  Feeding: Breast and formula Dispo: Plan for discharge 11/9.   LOS: 1 day   Smith Potenza Autry-Lott, DO 03/20/2024, 9:30 AM

## 2024-03-20 NOTE — Consult Note (Signed)
 Consultation  Referring Provider: OB/GYN/[facility] Primary Care Physician:  Pcp, No Primary Gastroenterologist: [place]  Reason for Consultation: Elevated LFTs postpartum  HPI: Alexandria Gross is a 18 y.o. female generally in good health, with history of anxiety who presented to the hospital yesterday at 40 weeks IUP and with contractions.  She had a successful vaginal delivery last night. Labs were done this morning showing WBC 10.9/hemoglobin 7.4/hematocrit 23.9 down from hemoglobin of 8.4 on admission, MCV 81/platelets 284 Potassium 3.9 T. bili 0.5/alk phos 144/AST 267/ALT 442  Patient had normal LFTs in July 2025 She has no previous history of any liver issues to her knowledge, no history of hepatitis, no EtOH or substance abuse, no family history of liver disease.  She reports that she did have onset of itching a couple of weeks ago and had an outpatient test through [facility] that suggested she may have cholestasis.  I do not see the results of that here.  Itching has resolved at this time, she was able to eat this morning, has no complaints of nausea or vomiting.  She says she has not had any significant bleeding postpartum and no significant pain.  Blood pressure has been normal, no documented hypotension with delivery   Past Medical History:  Diagnosis Date   Anxiety    UTI (urinary tract infection) during pregnancy     Past Surgical History:  Procedure Laterality Date   NO PAST SURGERIES      Prior to Admission medications   Medication Sig Start Date End Date Taking? Authorizing Provider  acetaminophen  (TYLENOL ) 500 MG tablet Take 2 tablets (1,000 mg total) by mouth every 6 (six) hours as needed for moderate pain (pain score 4-6) or mild pain (pain score 1-3) (for pain scale < 4  OR  temperature  >/=  100.5 F). 12/11/23  Yes Dillard, Naima, MD  folic acid (FOLVITE) 1 MG tablet Take 1 mg by mouth daily.   Yes [provider]  ondansetron  (ZOFRAN -ODT) 4 MG  disintegrating tablet Take 1 tablet (4 mg total) by mouth every 6 (six) hours as needed for nausea. 09/24/23  Yes Trudy Earnie LITTIE, CNM  Prenatal Vit-Fe Fumarate-FA (PRENATAL MULTIVITAMIN) TABS tablet Take 1 tablet by mouth daily at 12 noon.   Yes [provider]  Cholecalciferol (VITAMIN D3) 50 MCG (2000 UT) capsule Take 4,000 Units by mouth daily. 08/15/23   [provider]  cyclobenzaprine  (FLEXERIL ) 5 MG tablet Take 1 tablet (5 mg total) by mouth every 8 (eight) hours as needed. Patient not taking: Reported on 03/19/2024 09/24/23   Trudy Earnie LITTIE, CNM  DICLEGIS 10-10 MG TBEC Take 2 tablets by mouth at bedtime. Patient not taking: Reported on 03/19/2024 08/11/23   [provider]  fluconazole  (DIFLUCAN ) 150 MG tablet Take 1 tablet (150 mg total) by mouth daily. 12/09/23   Kumar, Agnijita, MD  NIFEdipine  (PROCARDIA ) 10 MG capsule Take 1 capsule (10 mg total) by mouth every 6 (six) hours as needed (contractions). 12/11/23   Armond Cape, MD  oxyCODONE  (OXY IR/ROXICODONE ) 5 MG immediate release tablet Take 1 tablet (5 mg total) by mouth every 6 (six) hours as needed for breakthrough pain or severe pain (pain score 7-10). 12/11/23   Armond Cape, MD  Prenatal Vit-Fe Fumarate-FA (PRENATAL MULTIVITAMIN) TABS tablet Take 1 tablet by mouth daily at 12 noon. 12/11/23   Armond Cape, MD    Current Facility-Administered Medications  Medication Dose Route Frequency Provider Last Rate Last Admin   0.9 %  sodium chloride  infusion   Intravenous PRN Autry-Lott, Simone, DO 10 mL/hr at 03/20/24 1154 New Bag at 03/20/24 1154   acetaminophen  (TYLENOL ) tablet 650 mg  650 mg Oral Q4H PRN Jones, Amanda K, CNM       albuterol  (PROVENTIL ) (2.5 MG/3ML) 0.083% nebulizer solution 2.5 mg  2.5 mg Nebulization Once PRN Autry-Lott, Simone, DO       benzocaine-Menthol (DERMOPLAST) 20-0.5 % topical spray 1 Application  1 Application Topical PRN Jones, Amanda K, CNM   1 Application at 03/19/24 1818    coconut oil  1 Application Topical PRN Jones, Amanda K, CNM       witch hazel-glycerin (TUCKS) pad 1 Application  1 Application Topical PRN Jones, Amanda K, CNM       And   dibucaine (NUPERCAINAL) 1 % rectal ointment 1 Application  1 Application Rectal PRN Jones, Amanda K, CNM       diphenhydrAMINE (BENADRYL) capsule 25 mg  25 mg Oral Q6H PRN Jones, Amanda K, CNM       diphenhydrAMINE (BENADRYL) injection 25 mg  25 mg Intravenous Once PRN Autry-Lott, Simone, DO       EPINEPHrine (EPI-PEN) injection 0.3 mg  0.3 mg Intramuscular Once PRN Autry-Lott, Simone, DO       ibuprofen  (ADVIL ) tablet 600 mg  600 mg Oral Q6H Jones, Amanda K, CNM   600 mg at 03/20/24 1148   iron sucrose (VENOFER) 500 mg in sodium chloride  0.9 % 250 mL IVPB  500 mg Intravenous Once Autry-Lott, Simone, DO 68.8 mL/hr at 03/20/24 1312 500 mg at 03/20/24 1312   methylPREDNISolone sodium succinate (SOLU-MEDROL) 125 mg/2 mL injection 125 mg  125 mg Intravenous Once PRN Autry-Lott, Simone, DO       ondansetron  (ZOFRAN ) tablet 4 mg  4 mg Oral Q4H PRN Jones, Amanda K, CNM   4 mg at 03/19/24 1549   Or   ondansetron  (ZOFRAN ) injection 4 mg  4 mg Intravenous Q4H PRN Jones, Amanda K, CNM       prenatal multivitamin tablet 1 tablet  1 tablet Oral Q1200 Jones, Amanda K, CNM   1 tablet at 03/20/24 1148   senna-docusate (Senokot-S) tablet 2 tablet  2 tablet Oral Daily Jones, Amanda K, CNM   2 tablet at 03/20/24 1147   simethicone  (MYLICON) chewable tablet 80 mg  80 mg Oral PRN Jones, Amanda K, CNM       sodium chloride  0.9 % bolus 500 mL  500 mL Intravenous Once PRN Autry-Lott, Simone, DO       Tdap (ADACEL) injection 0.5 mL  0.5 mL Intramuscular Once Jones, Amanda K, CNM       zolpidem (AMBIEN) tablet 5 mg  5 mg Oral QHS PRN Jones, Amanda K, CNM        Allergies as of 03/19/2024   (No Known Allergies)    Family History  Problem Relation Age of Onset   Cancer Maternal Grandfather    Diabetes Maternal Grandfather     Social History    Socioeconomic History   Marital status: Single    Spouse name: Not on file   Number of children: Not on file   Years of education: Not on file   Highest education level: Not on file  Occupational History   Not on file  Tobacco Use   Smoking status: Never    Passive exposure: Current   Smokeless tobacco: Never  Vaping Use   Vaping status: Never Used  Substance and Sexual Activity  Alcohol use: No   Drug use: Never   Sexual activity: Yes    Comment: one week ago  Other Topics Concern   Not on file  Social History Narrative   Not on file   Social Drivers of Health   Financial Resource Strain: Not on file  Food Insecurity: Patient Declined (03/19/2024)   Hunger Vital Sign    Worried About Running Out of Food in the Last Year: Patient declined    Ran Out of Food in the Last Year: Patient declined  Transportation Needs: Patient Declined (03/19/2024)   PRAPARE - Administrator, Civil Service (Medical): Patient declined    Lack of Transportation (Non-Medical): Patient declined  Physical Activity: Not on file  Stress: Not on file  Social Connections: Not on file  Intimate Partner Violence: Patient Declined (03/19/2024)   Humiliation, Afraid, Rape, and Kick questionnaire    Fear of Current or Ex-Partner: Patient declined    Emotionally Abused: Patient declined    Physically Abused: Patient declined    Sexually Abused: Patient declined    Review of Systems: Pertinent positive and negative review of systems were noted in the above HPI section.  All other review of systems was otherwise negative.   Physical Exam: Vital signs in last 24 hours: Temp:  [97.9 F (36.6 C)-99.1 F (37.3 C)] 97.9 F (36.6 C) (11/08 1307) Pulse Rate:  [77-101] 88 (11/08 1307) Resp:  [17-18] 18 (11/08 1307) BP: (94-126)/(55-89) 102/55 (11/08 1307) SpO2:  [97 %-98 %] 97 % (11/08 1307)   General:   Alert,  Well-developed, well-nourished young white female, pleasant and cooperative in  NAD-Emily at bedside Head:  Normocephalic and atraumatic. Eyes:  Sclera clear, no icterus.   Conjunctiva pink. Ears:  Normal auditory acuity. Nose:  No deformity, discharge,  or lesions. Mouth:  No deformity or lesions.   Neck:  Supple; no masses or thyromegaly. Lungs:  Clear throughout to auscultation.   No wheezes, crackles, or rhonchi.  Heart:  Regular rate and rhythm; no murmurs, clicks, rubs,  or gallops. Abdomen:  Soft, no palpable hepatosplenomegaly, Mild expected tenderness over uterus Rectal: Not done Msk:  Symmetrical without gross deformities. . Pulses:  Normal pulses noted. Extremities:  Without clubbing or edema. Neurologic:  Alert and  oriented x4;  grossly normal neurologically. Skin:  Intact without significant lesions or rashes.. Psych:  Alert and cooperative. Normal mood and affect.  Intake/Output from previous day: 11/07 0701 - 11/08 0700 In: -  Out: 1242 [Urine:1000; Blood:242] Intake/Output this shift: No intake/output data recorded.  Lab Results: Recent Labs    03/19/24 0245 03/20/24 0514  WBC 10.1 10.9*  HGB 8.4* 7.4*  HCT 27.4* 23.9*  PLT 347 284   BMET Recent Labs    03/20/24 0953  NA 134*  K 3.9  CL 102  CO2 24  GLUCOSE 75  BUN 6  CREATININE 0.51  CALCIUM  8.4*   LFT Recent Labs    03/20/24 0953  PROT 5.6*  ALBUMIN 2.0*  AST 267*  ALT 442*  ALKPHOS 144*  BILITOT 0.5   PT/INR No results for input(s): LABPROT, INR in the last 72 hours. Hepatitis Panel No results for input(s): HEPBSAG, HCVAB, HEPAIGM, HEPBIGM in the last 72 hours.   IMPRESSION:   #40  18 year old white female 1 day postpartum with vaginal delivery of female infant #2 elevated LFTs noted on day 1 postpartum-no recent LFTs for comparison Normal platelet count, and normal T. bili at 0.5 Question of cholestasis  of pregnancy as patient had onset of pruritus over the past couple of weeks which is now resolved.  HELLP syndrome-unlikely with normal  platelet count Ischemic hepatitis unlikely with no documented hypotension or significant hemorrhage with delivery Acute fatty liver of pregnancy-on the differential however she has not had associated hyperbilirubinemia or hypoglycemia  Rule out Dili-drug-induced liver injury-anesthetics Rule out acute viral hepatitis Rule out Budd-Chiari-hepatic vein thrombosis-fortunately she has no complaints of abdominal pain  Plan; continue to monitor CBC, check pro time/INR, follow LFTs-c-Met in a.m. Will send off viral serologies for acute hepatitis A, B and C/HSV/CMV and EBV Will schedule for abdominal ultrasound with Doppler of portal system GI will follow with you    Oshae Simmering PA-C 03/20/2024, 3:32 PM

## 2024-03-21 ENCOUNTER — Inpatient Hospital Stay (HOSPITAL_COMMUNITY)

## 2024-03-21 ENCOUNTER — Other Ambulatory Visit (HOSPITAL_COMMUNITY): Payer: Self-pay | Admitting: Radiology

## 2024-03-21 DIAGNOSIS — R748 Abnormal levels of other serum enzymes: Secondary | ICD-10-CM | POA: Diagnosis not present

## 2024-03-21 DIAGNOSIS — K8020 Calculus of gallbladder without cholecystitis without obstruction: Secondary | ICD-10-CM

## 2024-03-21 DIAGNOSIS — D509 Iron deficiency anemia, unspecified: Secondary | ICD-10-CM | POA: Diagnosis present

## 2024-03-21 DIAGNOSIS — K838 Other specified diseases of biliary tract: Secondary | ICD-10-CM

## 2024-03-21 LAB — IRON AND TIBC
Iron: 608 ug/dL — ABNORMAL HIGH (ref 28–170)
Saturation Ratios: 84 % — ABNORMAL HIGH (ref 10.4–31.8)
TIBC: 720 ug/dL — ABNORMAL HIGH (ref 250–450)
UIBC: 112 ug/dL

## 2024-03-21 LAB — CBC WITH DIFFERENTIAL/PLATELET
Abs Immature Granulocytes: 0.06 K/uL (ref 0.00–0.07)
Basophils Absolute: 0 K/uL (ref 0.0–0.1)
Basophils Relative: 0 %
Eosinophils Absolute: 0.2 K/uL (ref 0.0–0.5)
Eosinophils Relative: 2 %
HCT: 25.6 % — ABNORMAL LOW (ref 36.0–46.0)
Hemoglobin: 7.9 g/dL — ABNORMAL LOW (ref 12.0–15.0)
Immature Granulocytes: 1 %
Lymphocytes Relative: 16 %
Lymphs Abs: 1.5 K/uL (ref 0.7–4.0)
MCH: 25.2 pg — ABNORMAL LOW (ref 26.0–34.0)
MCHC: 30.9 g/dL (ref 30.0–36.0)
MCV: 81.8 fL (ref 80.0–100.0)
Monocytes Absolute: 0.9 K/uL (ref 0.1–1.0)
Monocytes Relative: 10 %
Neutro Abs: 6.6 K/uL (ref 1.7–7.7)
Neutrophils Relative %: 71 %
Platelets: 289 K/uL (ref 150–400)
RBC: 3.13 MIL/uL — ABNORMAL LOW (ref 3.87–5.11)
RDW: 15.9 % — ABNORMAL HIGH (ref 11.5–15.5)
WBC: 9.2 K/uL (ref 4.0–10.5)
nRBC: 0 % (ref 0.0–0.2)

## 2024-03-21 LAB — COMPREHENSIVE METABOLIC PANEL WITH GFR
ALT: 440 U/L — ABNORMAL HIGH (ref 0–44)
AST: 227 U/L — ABNORMAL HIGH (ref 15–41)
Albumin: 2.1 g/dL — ABNORMAL LOW (ref 3.5–5.0)
Alkaline Phosphatase: 140 U/L — ABNORMAL HIGH (ref 38–126)
Anion gap: 11 (ref 5–15)
BUN: 6 mg/dL (ref 6–20)
CO2: 23 mmol/L (ref 22–32)
Calcium: 8.8 mg/dL — ABNORMAL LOW (ref 8.9–10.3)
Chloride: 103 mmol/L (ref 98–111)
Creatinine, Ser: 0.44 mg/dL (ref 0.44–1.00)
GFR, Estimated: >60 mL/min (ref >60)
Glucose, Bld: 65 mg/dL — ABNORMAL LOW (ref 70–99)
Potassium: 3.9 mmol/L (ref 3.5–5.1)
Sodium: 137 mmol/L (ref 135–145)
Total Bilirubin: 0.7 mg/dL (ref 0.0–1.2)
Total Protein: 6 g/dL — ABNORMAL LOW (ref 6.5–8.1)

## 2024-03-21 LAB — BIRTH TISSUE RECOVERY COLLECTION (PLACENTA DONATION)

## 2024-03-21 LAB — CMV IGM: CMV IgM: <30 [AU]/ml (ref 0.0–29.9)

## 2024-03-21 LAB — FERRITIN: Ferritin: 621 ng/mL — ABNORMAL HIGH (ref 11–307)

## 2024-03-21 LAB — PROTIME-INR
INR: 0.9 (ref 0.8–1.2)
Prothrombin Time: 12.5 s (ref 11.4–15.2)

## 2024-03-21 MED ORDER — IBUPROFEN 600 MG PO TABS: 600.0000 mg | ORAL_TABLET | Freq: Four times a day (QID) | ORAL | 0 refills | Status: DC

## 2024-03-21 MED ORDER — GADOBUTROL 1 MMOL/ML IV SOLN
7.0000 mL | Freq: Once | INTRAVENOUS | Status: AC | PRN
  Administered 2024-03-21: 7 mL via INTRAVENOUS

## 2024-03-21 MED ORDER — POLYSACCHARIDE IRON COMPLEX 150 MG PO CAPS: 150.0000 mg | ORAL_CAPSULE | Freq: Every day | ORAL | 3 refills | Status: AC

## 2024-03-21 NOTE — Progress Notes (Signed)
 Progress Note   Assessment    18 year old female day 2 postpartum with elevated liver enzymes, ultrasound shows numerous gallstones and borderline CBD dilation  Principal Problem:   Postpartum care following vaginal delivery 11/7 Active Problems:   Normal labor   SVD (spontaneous vaginal delivery)   Cholestasis of pregnancy--new dx 03/20/24   IDA (iron deficiency anemia)   Recommendations   Elevated LFTs/normal plts/gallstones and dilated CBD -- hepatocellular pattern of injury, alk phos up a bit.  CBD is big for age.  Clinically she looks well.  This is not Budd-Chiari or HELLP.  Acute viral hepatitis negative.  Cannot exclude DILI. --MRI/MRCP today --Trend LFTs daily --If MRCP negative can go home and will need follow-up liver enzymes later this week  35 minutes total spent today including patient facing time, coordination of care, reviewing medical history/procedures/pertinent radiology studies, and documentation of the encounter.    Chief Complaint   Patient feeling well No abdominal pain Her infant is doing well Father at bedside Hepatic ultrasound patent blood flow  Vital signs in last 24 hours: Temp:  [97.9 F (36.6 C)-98.2 F (36.8 C)] 98.2 F (36.8 C) (11/09 0520) Pulse Rate:  [67-88] 67 (11/09 0520) Resp:  [17-18] 18 (11/09 0520) BP: (102-116)/(55-80) 111/62 (11/09 0520) SpO2:  [97 %] 97 % (11/08 1307)  Gen: awake, alert, NAD HEENT: anicteric +BS throughout Ext: no c/c/e Neuro: nonfocal   Lab Results: Recent Labs    03/19/24 0245 03/20/24 0514 03/21/24 0501  WBC 10.1 10.9* 9.2  HGB 8.4* 7.4* 7.9*  HCT 27.4* 23.9* 25.6*  PLT 347 284 289   BMET Recent Labs    03/20/24 0953 03/21/24 0501  NA 134* 137  K 3.9 3.9  CL 102 103  CO2 24 23  GLUCOSE 75 65*  BUN 6 6  CREATININE 0.51 0.44  CALCIUM  8.4* 8.8*   LFT Recent Labs    03/20/24 1657 03/21/24 0501  PROT 6.1* 6.0*  ALBUMIN 2.2* 2.1*  AST 265* 227*  ALT 445* 440*  ALKPHOS  157* 140*  BILITOT 0.8 0.7  BILIDIR 0.2  --   IBILI 0.6  --    PT/INR Recent Labs    03/21/24 0501  LABPROT 12.5  INR 0.9   Hepatitis Panel Recent Labs    03/20/24 1657  HEPBSAG NON REACTIVE  HCVAB NON REACTIVE  HEPAIGM NON REACTIVE  HEPBIGM NON REACTIVE    Studies/Results: US  ABD LTD RUG W/LIVER DOPPLER Result Date: 03/21/2024 CLINICAL DATA:  Recently postpartum with elevated LFTs. Assess for Budd-Chiari (hepatic venous thrombosis). EXAM: DUPLEX ULTRASOUND OF LIVER TECHNIQUE: Color and duplex Doppler ultrasound was performed to evaluate the hepatic in-flow and out-flow vessels. COMPARISON:  None Available. FINDINGS: Liver: Mildly elevated echogenicity of the hepatic parenchyma with associated coarsening. Findings are consistent with steatosis. Normal hepatic contour without nodularity. No focal lesion, mass or intrahepatic biliary ductal dilatation. Main Portal Vein size: 1.3 cm Portal Vein Velocities Main Prox:  34 cm/sec Main Mid: 27 cm/sec with normal hepatopetal directional flow. Main Dist:  32 cm/sec Right: 20 cm/sec with normal hepatopetal directional flow. Left: 20 cm/sec with normal hepatopetal directional flow. Hepatic Vein Velocities Right:  21 cm/sec Middle:  34 cm/sec Left:  15 cm/sec IVC: Present and patent with normal respiratory phasicity. Hepatic Artery Velocity:  42 cm/sec Splenic Vein Velocity:  27 cm/sec Spleen: 14.6 cm x 5.4 cm x 4.9 cm with a total volume of 205 cm^3 (411 cm^3 is upper limit normal) Portal Vein Occlusion/Thrombus:  No Splenic Vein Occlusion/Thrombus: No Ascites: None Varices: None Other: Numerous echogenic foci with posterior acoustic shadowing noted throughout the gallbladder consistent with cholelithiasis. The common bile duct is upper limits of normal at 6.9 mm. No definite common duct stone identified. IMPRESSION: 1. Widely patent hepatic and portal venous systems. No evidence of thrombosis. 2. Cholelithiasis. The common bile duct is at the upper  limits of normal at 6.9 mm in diameter. 3. Mild hepatic steatosis. Electronically Signed   By: Wilkie Lent M.D.   On: 03/21/2024 08:02      LOS: 2 days   Gordy CHRISTELLA Starch, MD 03/21/2024, 11:30 AM See [facility], [facility], to contact our on call provider

## 2024-03-21 NOTE — Discharge Summary (Signed)
 SVD OB Discharge Summary                               Patient Name: Alexandria Gross DOB: [birth year] MRN: [id number]   Date of admission: 03/19/2024 Delivering MD: JOSHUA PALMA K Date of delivery: 03/19/2024 Type of delivery: SVD   Newborn Data: Sex: Baby female Live born female  Birth Weight: 7 lb 6.2 oz (3350 g) APGAR: 8, 9   Newborn Delivery   Birth date/time: 03/19/2024 13:29:00 Delivery type: Vaginal, Spontaneous        Feeding: breast and bottle Infant being discharge to home with mother in stable condition.    Admitting diagnosis: Indication for care in labor or delivery [O75.9] Intrauterine pregnancy: [redacted]w[redacted]d     Secondary diagnosis:  Principal Problem:   Postpartum care following vaginal delivery 11/7 Active Problems:   Normal labor   SVD (spontaneous vaginal delivery)   Cholestasis of pregnancy--new dx 03/20/24   IDA (iron deficiency anemia)                                 Complications: None                                                              Intrapartum Procedures: spontaneous vaginal delivery Postpartum Procedures: none Complications-Operative and Postpartum: right labial degree perineal laceration Augmentation: AROM and Pitocin     History of Present Illness: Alexandria Gross is a 18 y.o. female, G1P1001, who presents at [redacted]w[redacted]d weeks gestation. The patient has been followed at  [facility]  Her pregnancy has been complicated by:       Patient Active Problem List    Diagnosis Date Noted   IDA (iron deficiency anemia) 03/21/2024   Cholestasis of pregnancy--new dx 03/20/24 03/20/2024   Normal labor 03/19/2024   SVD (spontaneous vaginal delivery) 03/19/2024   Postpartum care following vaginal delivery 11/7 03/19/2024          Active Ambulatory Problems    Diagnosis Date Noted   No Active Ambulatory Problems        Resolved Ambulatory Problems    Diagnosis Date Noted   Abdominal pain affecting pregnancy  12/10/2023   Abdominal pain complicating pregnancy 12/10/2023        Past Medical History:  Diagnosis Date   Anxiety     UTI (urinary tract infection) during pregnancy        Hospital course:  Onset of Labor With Vaginal Delivery      18 y.o. yo G1P1001 at [redacted]w[redacted]d was admitted in Latent Labor on 03/19/2024. Labor course was complicated bypt was admitted on 11/7 in latent labor and progressed to svd with pitocin and arom over right labile tear , ebl was , and hgb drop of 8.9-7.4, asymptomatic, with IDA, on po iron  Membrane Rupture Time/Date: 8:56 AM,03/19/2024  Delivery Method:Vaginal, Spontaneous Operative Delivery:N/A Episiotomy: None Lacerations:  Labial Patient had a postpartum course complicated by pt was noted to have cholestasis of pregnancy but labs resulted after she delivered, she is being cross covered by GI, and they are doing a full work up for her,    AST:  214, [phone] ALT: [phone] Acute Hep Panel: Neg LDH: 206 CMV: pending EBV: pending      She will have F/U with GI, and a scan today prior to discharge if all is well. From an obstetric standpoint is is able to be cleared today. .  She is ambulating, tolerating a regular diet, passing flatus, and urinating well. Patient is discharged home in stable condition on 03/21/24.   Newborn Data: Birth date:03/19/2024 Birth time:1:29 PM Gender:Female Living status:Living Apgars:8 ,9  Weight:3350 g Postpartum Day # 2 :. Patient up ad lib, denies syncope or dizziness. Reports consuming regular diet without issues and denies N/V. Patient reports 0 bowel movement + passing flatus.  Denies issues with urination and reports bleeding is lighter.  Patient is bottle, br feeding and reports going well.  Desires undecided for postpartum contraception.  Pain is being appropriately managed with use of po meds.     Physical exam        Vitals:    03/20/24 1307 03/20/24 1923 03/20/24 2000 03/21/24 0520  BP: (!) 102/55  116/80 115/78 111/62  Pulse: 88 72 70 67  Resp: 18 17   18   Temp: 97.9 F (36.6 C) 98 F (36.7 C)   98.2 F (36.8 C)  TempSrc: Oral Oral   Oral  SpO2: 97%        Weight:          Height:            General: alert, cooperative, and no distress Lochia: appropriate Uterine Fundus: firm Perineum: approximate DVT Evaluation: No evidence of DVT seen on physical exam. Negative Homan's sign. No cords or calf tenderness. No significant calf/ankle edema.   Labs: Recent Labs       Lab Results  Component Value Date    WBC 9.2 03/21/2024    HGB 7.9 (L) 03/21/2024    HCT 25.6 (L) 03/21/2024    MCV 81.8 03/21/2024    PLT 289 03/21/2024          Latest Ref Rng & Units 03/21/2024    5:01 AM  CMP  Glucose 70 - 99 mg/dL 65   BUN 6 - 20 mg/dL 6   Creatinine 9.55 - 8.99 mg/dL 9.55   Sodium 864 - 854 mmol/L 137   Potassium 3.5 - 5.1 mmol/L 3.9   Chloride 98 - 111 mmol/L 103   CO2 22 - 32 mmol/L 23   Calcium  8.9 - 10.3 mg/dL 8.8   Total Protein 6.5 - 8.1 g/dL 6.0   Total Bilirubin 0.0 - 1.2 mg/dL 0.7   Alkaline Phos 38 - 126 U/L 140   AST 15 - 41 U/L 227   ALT 0 - 44 U/L 440       Date of discharge: 03/21/2024 Discharge Diagnoses: Term Pregnancy-delivered and cholestasis Discharge instruction: per After Visit Summary and Baby and Me Booklet.   After visit meds:    Activity:           unrestricted and pelvic rest Advance as tolerated. Pelvic rest for 6 weeks.  Diet:                routine Medications: PNV, Ibuprofen , and Iron Postpartum contraception: Undecided Condition:  Pt discharge to home with baby in stable   Meds: Allergies as of 03/21/2024   No Known Allergies         Medication List       STOP taking these medications     cyclobenzaprine  5 MG  tablet Commonly known as: FLEXERIL     Diclegis 10-10 MG Tbec Generic drug: Doxylamine-Pyridoxine    fluconazole  150 MG tablet Commonly known as: Diflucan     folic acid 1 MG tablet Commonly known as: FOLVITE     NIFEdipine  10 MG capsule Commonly known as: PROCARDIA     ondansetron  4 MG disintegrating tablet Commonly known as: ZOFRAN -ODT    oxyCODONE  5 MG immediate release tablet Commonly known as: Oxy IR/ROXICODONE            TAKE these medications     acetaminophen  500 MG tablet Commonly known as: TYLENOL  Take 2 tablets (1,000 mg total) by mouth every 6 (six) hours as needed for moderate pain (pain score 4-6) or mild pain (pain score 1-3) (for pain scale < 4  OR  temperature  >/=  100.5 F).    ibuprofen  600 MG tablet Commonly known as: ADVIL  Take 1 tablet (600 mg total) by mouth every 6 (six) hours.    iron polysaccharides 150 MG capsule Commonly known as: NIFEREX Take 1 capsule (150 mg total) by mouth daily.    prenatal multivitamin Tabs tablet Take 1 tablet by mouth daily at 12 noon. What changed: Another medication with the same name was removed. Continue taking this medication, and follow the directions you see here.    Vitamin D3 50 MCG (2000 UT) capsule Take 4,000 Units by mouth daily.             Discharge Follow Up:    Follow-up Information       [facility]. Schedule an appointment as soon as possible for a visit in 1 week(s).   Specialty: Obstetrics and Gynecology Why: Will get f/u with GI, may need LFT repeated with this week at [facility], and 6 weeks PPV Contact information: [address] [place]  [address] [phone]                        [facility] Harrol Novello  CNM, FNP-C, PMHNP-BC  [id number]  [place], [place] [address]  Cell: [phone]  Office Phone: [phone] Fax: [phone] 03/21/2024  11:04 AM

## 2024-03-21 NOTE — H&P (Deleted)
 SVD OB Discharge Summary       Patient Name: Alexandria Gross DOB: 12/04/2005 MRN: [id number]  Date of admission: 03/19/2024 Delivering MD: JOSHUA PALMA K Date of delivery: 03/19/2024 Type of delivery: SVD  Newborn Data: Sex: Baby female Live born female  Birth Weight: 7 lb 6.2 oz (3350 g) APGAR: 8, 9  Newborn Delivery   Birth date/time: 03/19/2024 13:29:00 Delivery type: Vaginal, Spontaneous     Feeding: breast and bottle Infant being discharge to home with mother in stable condition.   Admitting diagnosis: Indication for care in labor or delivery [O75.9] Intrauterine pregnancy: [redacted]w[redacted]d     Secondary diagnosis:  Principal Problem:   Postpartum care following vaginal delivery 11/7 Active Problems:   Normal labor   SVD (spontaneous vaginal delivery)   Cholestasis of pregnancy--new dx 03/20/24   IDA (iron deficiency anemia)                                Complications: None                                                              Intrapartum Procedures: spontaneous vaginal delivery Postpartum Procedures: none Complications-Operative and Postpartum: right labial degree perineal laceration Augmentation: AROM and Pitocin   History of Present Illness: Alexandria Gross is a 18 y.o. female, G1P1001, who presents at [redacted]w[redacted]d weeks gestation. The patient has been followed at  [facility]  Her pregnancy has been complicated by:  Patient Active Problem List   Diagnosis Date Noted   IDA (iron deficiency anemia) 03/21/2024   Cholestasis of pregnancy--new dx 03/20/24 03/20/2024   Normal labor 03/19/2024   SVD (spontaneous vaginal delivery) 03/19/2024   Postpartum care following vaginal delivery 11/7 03/19/2024     Active Ambulatory Problems    Diagnosis Date Noted   No Active Ambulatory Problems   Resolved Ambulatory Problems    Diagnosis Date Noted   Abdominal pain affecting pregnancy 12/10/2023   Abdominal pain complicating  pregnancy 12/10/2023   Past Medical History:  Diagnosis Date   Anxiety    UTI (urinary tract infection) during pregnancy      Hospital course:  Onset of Labor With Vaginal Delivery      17 y.o. yo G1P1001 at [redacted]w[redacted]d was admitted in Latent Labor on 03/19/2024. Labor course was complicated bypt was admitted on 11/7 in latent labor and progressed to svd with pitocin and arom over right labile tear , ebl was , and hgb drop of 8.9-7.4, asymptomatic, with IDA, on po iron  Membrane Rupture Time/Date: 8:56 AM,03/19/2024  Delivery Method:Vaginal, Spontaneous Operative Delivery:N/A Episiotomy: None Lacerations:  Labial Patient had a postpartum course complicated by pt was noted to have cholestasis of pregnancy but labs resulted after she delivered, she is being cross covered by GI, and they are doing a full work up for her,   AST: 214, [phone] ALT: [phone] Acute Hep Panel: Neg LDH: 206 CMV: pending EBV: pending    She will have F/U with GI, and a scan today prior to discharge if all is well. From an obstetric standpoint is is able to be cleared today. .  She is ambulating, tolerating a regular diet, passing flatus, and  urinating well. Patient is discharged home in stable condition on 03/21/24.  Newborn Data: Birth date:03/19/2024 Birth time:1:29 PM Gender:Female Living status:Living Apgars:8 ,9  Weight:3350 g Postpartum Day # 2 :. Patient up ad lib, denies syncope or dizziness. Reports consuming regular diet without issues and denies N/V. Patient reports 0 bowel movement + passing flatus.  Denies issues with urination and reports bleeding is lighter.  Patient is bottle, br feeding and reports going well.  Desires undecided for postpartum contraception.  Pain is being appropriately managed with use of po meds.    Physical exam  Vitals:   03/20/24 1307 03/20/24 1923 03/20/24 2000 03/21/24 0520  BP: (!) 102/55 116/80 115/78 111/62  Pulse: 88 72 70 67  Resp: 18 17  18    Temp: 97.9 F (36.6 C) 98 F (36.7 C)  98.2 F (36.8 C)  TempSrc: Oral Oral  Oral  SpO2: 97%     Weight:      Height:       General: alert, cooperative, and no distress Lochia: appropriate Uterine Fundus: firm Perineum: approximate DVT Evaluation: No evidence of DVT seen on physical exam. Negative Homan's sign. No cords or calf tenderness. No significant calf/ankle edema.  Labs: Lab Results  Component Value Date   WBC 9.2 03/21/2024   HGB 7.9 (L) 03/21/2024   HCT 25.6 (L) 03/21/2024   MCV 81.8 03/21/2024   PLT 289 03/21/2024      Latest Ref Rng & Units 03/21/2024    5:01 AM  CMP  Glucose 70 - 99 mg/dL 65   BUN 6 - 20 mg/dL 6   Creatinine 9.55 - 8.99 mg/dL 9.55   Sodium 864 - 854 mmol/L 137   Potassium 3.5 - 5.1 mmol/L 3.9   Chloride 98 - 111 mmol/L 103   CO2 22 - 32 mmol/L 23   Calcium  8.9 - 10.3 mg/dL 8.8   Total Protein 6.5 - 8.1 g/dL 6.0   Total Bilirubin 0.0 - 1.2 mg/dL 0.7   Alkaline Phos 38 - 126 U/L 140   AST 15 - 41 U/L 227   ALT 0 - 44 U/L 440     Date of discharge: 03/21/2024 Discharge Diagnoses: Term Pregnancy-delivered and cholestasis Discharge instruction: per After Visit Summary and Baby and Me Booklet.  After visit meds:   Activity:           unrestricted and pelvic rest Advance as tolerated. Pelvic rest for 6 weeks.  Diet:                routine Medications: PNV, Ibuprofen , and Iron Postpartum contraception: Undecided Condition:  Pt discharge to home with baby in stable  Meds: Allergies as of 03/21/2024   No Known Allergies      Medication List     STOP taking these medications    cyclobenzaprine  5 MG tablet Commonly known as: FLEXERIL    Diclegis 10-10 MG Tbec Generic drug: Doxylamine-Pyridoxine   fluconazole  150 MG tablet Commonly known as: Diflucan    folic acid 1 MG tablet Commonly known as: FOLVITE   NIFEdipine  10 MG capsule Commonly known as: PROCARDIA    ondansetron  4 MG disintegrating tablet Commonly known as:  ZOFRAN -ODT   oxyCODONE  5 MG immediate release tablet Commonly known as: Oxy IR/ROXICODONE        TAKE these medications    acetaminophen  500 MG tablet Commonly known as: TYLENOL  Take 2 tablets (1,000 mg total) by mouth every 6 (six) hours as needed for moderate pain (pain score 4-6) or mild  pain (pain score 1-3) (for pain scale < 4  OR  temperature  >/=  100.5 F).   ibuprofen  600 MG tablet Commonly known as: ADVIL  Take 1 tablet (600 mg total) by mouth every 6 (six) hours.   iron polysaccharides 150 MG capsule Commonly known as: NIFEREX Take 1 capsule (150 mg total) by mouth daily.   prenatal multivitamin Tabs tablet Take 1 tablet by mouth daily at 12 noon. What changed: Another medication with the same name was removed. Continue taking this medication, and follow the directions you see here.   Vitamin D3 50 MCG (2000 UT) capsule Take 4,000 Units by mouth daily.        Discharge Follow Up:   Follow-up Information     [facility]. Schedule an appointment as soon as possible for a visit in 1 week(s).   Specialty: Obstetrics and Gynecology Why: Will get f/u with GI, may need LFT repeated with this week at [facility], and 6 weeks PPV Contact information: [address] [place]  [address] [phone]                 [facility] Maridee Slape  CNM, FNP-C, PMHNP-BC  [id number]  [place], [place] [address]  Cell: [phone]  Office Phone: [phone] Fax: [phone] 03/21/2024  11:04 AM

## 2024-03-21 NOTE — Lactation Note (Signed)
 This note was copied from a baby's chart. Lactation Consultation Note  Patient Name: Alexandria Gross Unijb'd Date: 03/21/2024 Age:19 hours Reason for consult: Follow-up assessment;Primapara;1st time breastfeeding;Term;Infant weight loss;Exclusive pumping and bottle feeding Per mom has pumped x 1 in the last 24 hours and got small amount.  Per mom when pumping saw a small amount of blood. LC offered to assess breast tissue and mom receptive, no break down,  LC provided coconut oil with instructions and recommended pumping with it on the base of the nipple and areola. Along with increasing flange up to #21 F is needed.  LC reviewed supply and demand, importance of pumping around feeding times at least 8-10 times a day. LC stressed the importance of engorgement prevention and tx .  Mom aware of the storage of breast milk and LC resources.  Dad had feed the baby and changed the diaper.   Maternal Data    Feeding Mother's Current Feeding Choice: Breast Milk and Formula Nipple Type: Slow - flow    Lactation Tools Discussed/Used Tools: Pump;Flanges Flange Size: 18;21 Breast pump type: Double-Electric Breast Pump Pump Education: Setup, frequency, and cleaning;Milk Storage Pumped volume:  (per mom pumped x 1 - with small amount)  Interventions Interventions: Breast feeding basics reviewed;Coconut oil;Hand pump;DEBP;Education;LC Services brochure;CDC milk storage guidelines;CDC Guidelines for Breast Pump Cleaning  Discharge Discharge Education: Engorgement and breast care;Warning signs for feeding baby Pump: Hands Free;Manual;Personal (pe rmom Mom Cozy)  Consult Status Consult Status: Complete Date: 03/21/24    Rollene Jenkins Fiedler 03/21/2024, 10:17 AM

## 2024-03-22 ENCOUNTER — Other Ambulatory Visit: Payer: Self-pay

## 2024-03-22 ENCOUNTER — Telehealth: Payer: Self-pay

## 2024-03-22 ENCOUNTER — Other Ambulatory Visit (HOSPITAL_COMMUNITY): Payer: Self-pay | Admitting: Internal Medicine

## 2024-03-22 DIAGNOSIS — K8050 Calculus of bile duct without cholangitis or cholecystitis without obstruction: Secondary | ICD-10-CM

## 2024-03-22 DIAGNOSIS — O26649 Intrahepatic cholestasis of pregnancy, unspecified trimester: Secondary | ICD-10-CM

## 2024-03-22 LAB — EPSTEIN-BARR VIRUS VCA, IGM: EBV VCA IgM: <36 U/mL (ref 0.0–35.9)

## 2024-03-22 NOTE — Telephone Encounter (Signed)
 Lab orders entered as requested. Call placed to pt, had to leave detailed message for pt to come in for lab on Wednesday, our address and location of lab, no appt needed.

## 2024-03-22 NOTE — Telephone Encounter (Signed)
-----   Message from Greig Corti sent at 03/21/2024  4:37 PM EST ----- Regarding: labs Patient was hospitalized this weekend, elevated LFTs immediately postpartum. She will be discharged today  Please place orders for CBC with differential, c-Met, pro time/INR, under Dr. Pamula name Please call her and let her know I would like her to come on Wednesday, 03/24/2024 for the labs very important that she keep this follow-up

## 2024-03-23 ENCOUNTER — Ambulatory Visit: Payer: Self-pay | Admitting: Internal Medicine

## 2024-03-23 DIAGNOSIS — R9389 Abnormal findings on diagnostic imaging of other specified body structures: Secondary | ICD-10-CM

## 2024-04-02 ENCOUNTER — Emergency Department (HOSPITAL_COMMUNITY)

## 2024-04-02 ENCOUNTER — Emergency Department (HOSPITAL_COMMUNITY)
Admission: EM | Admit: 2024-04-02 | Discharge: 2024-04-02 | Disposition: A | Discharge status: Home / Self Care | Source: Ambulatory Visit | Attending: Emergency Medicine | Admitting: Emergency Medicine

## 2024-04-02 ENCOUNTER — Encounter (HOSPITAL_COMMUNITY): Payer: Self-pay

## 2024-04-02 ENCOUNTER — Ambulatory Visit (HOSPITAL_COMMUNITY)
Admission: EM | Admit: 2024-04-02 | Discharge: 2024-04-02 | Disposition: A | Discharge status: Home / Self Care | Attending: Emergency Medicine | Admitting: Emergency Medicine

## 2024-04-02 ENCOUNTER — Other Ambulatory Visit: Payer: Self-pay

## 2024-04-02 DIAGNOSIS — O2663 Liver and biliary tract disorders in the puerperium: Secondary | ICD-10-CM | POA: Diagnosis not present

## 2024-04-02 DIAGNOSIS — R112 Nausea with vomiting, unspecified: Secondary | ICD-10-CM | POA: Diagnosis not present

## 2024-04-02 DIAGNOSIS — K8020 Calculus of gallbladder without cholecystitis without obstruction: Secondary | ICD-10-CM | POA: Diagnosis not present

## 2024-04-02 DIAGNOSIS — Z3202 Encounter for pregnancy test, result negative: Secondary | ICD-10-CM

## 2024-04-02 DIAGNOSIS — R109 Unspecified abdominal pain: Secondary | ICD-10-CM | POA: Diagnosis not present

## 2024-04-02 DIAGNOSIS — O99893 Other specified diseases and conditions complicating puerperium: Secondary | ICD-10-CM | POA: Diagnosis present

## 2024-04-02 LAB — CBC
HCT: 34.3 % — ABNORMAL LOW (ref 36.0–46.0)
Hemoglobin: 10.4 g/dL — ABNORMAL LOW (ref 12.0–15.0)
MCH: 25.6 pg — ABNORMAL LOW (ref 26.0–34.0)
MCHC: 30.3 g/dL (ref 30.0–36.0)
MCV: 84.5 fL (ref 80.0–100.0)
Platelets: 447 K/uL — ABNORMAL HIGH (ref 150–400)
RBC: 4.06 MIL/uL (ref 3.87–5.11)
RDW: 18.3 % — ABNORMAL HIGH (ref 11.5–15.5)
WBC: 7.8 K/uL (ref 4.0–10.5)
nRBC: 0 % (ref 0.0–0.2)

## 2024-04-02 LAB — POCT URINE DIPSTICK
Bilirubin, UA: NEGATIVE
Glucose, UA: NEGATIVE mg/dL
Nitrite, UA: NEGATIVE
POC PROTEIN,UA: 30 — AB
Spec Grav, UA: 1.02 (ref 1.010–1.025)
Urobilinogen, UA: 1 U/dL
pH, UA: 7.5 (ref 5.0–8.0)

## 2024-04-02 LAB — COMPREHENSIVE METABOLIC PANEL WITH GFR
ALT: 54 U/L — ABNORMAL HIGH (ref 0–44)
AST: 25 U/L (ref 15–41)
Albumin: 4.2 g/dL (ref 3.5–5.0)
Alkaline Phosphatase: 114 U/L (ref 38–126)
Anion gap: 10 (ref 5–15)
BUN: 7 mg/dL (ref 6–20)
CO2: 27 mmol/L (ref 22–32)
Calcium: 9.8 mg/dL (ref 8.9–10.3)
Chloride: 103 mmol/L (ref 98–111)
Creatinine, Ser: 0.59 mg/dL (ref 0.44–1.00)
GFR, Estimated: >60 mL/min (ref >60)
Glucose, Bld: 85 mg/dL (ref 70–99)
Potassium: 3.8 mmol/L (ref 3.5–5.1)
Sodium: 140 mmol/L (ref 135–145)
Total Bilirubin: 0.3 mg/dL (ref 0.0–1.2)
Total Protein: 7.8 g/dL (ref 6.5–8.1)

## 2024-04-02 LAB — POCT URINE PREGNANCY: Preg Test, Ur: NEGATIVE

## 2024-04-02 LAB — LIPASE, BLOOD: Lipase: 25 U/L (ref 11–51)

## 2024-04-02 MED ORDER — ONDANSETRON 8 MG PO TBDP
8.0000 mg | ORAL_TABLET | Freq: Three times a day (TID) | ORAL | 0 refills | Status: AC | PRN
Start: 2024-04-02 — End: ?

## 2024-04-02 MED ORDER — ACETAMINOPHEN 500 MG PO TABS: 500.0000 mg | ORAL_TABLET | Freq: Four times a day (QID) | ORAL | 0 refills | Status: AC | PRN

## 2024-04-02 NOTE — Discharge Instructions (Addendum)
 You were seen in the emergency room for abdominal pain.  We suspect that your post meal pain is because of gallstones or cholelithiasis.  Fortunately, the workup in the emergency room does not reveal acute cholecystitis which needs urgent surgery.  Your liver enzymes have also improved.  At this time, we recommend that you follow-up with general surgeons by scheduling an appointment and discuss if it is a good idea to remove your gallbladder electively.  If you start having excruciating pain, severe nausea and vomiting, fevers or jaundice (yellowing of the skin or white of your eye) -please return to the emergency room immediately.

## 2024-04-02 NOTE — ED Triage Notes (Signed)
 Patient c/o  generalized abdominal pain and vomiting. Patient gave birth 2 weeks ago and was told she had gall stones.  Patient has not had any medications for her symptoms. Patient states she pumps here and there.

## 2024-04-02 NOTE — Discharge Instructions (Signed)
Please go to the emergency department for further evaluation.

## 2024-04-02 NOTE — ED Provider Notes (Signed)
 [facility]    CSN: [id number] Arrival date & time: 04/02/24  8084      History   Chief Complaint Chief Complaint  Patient presents with   Abdominal Pain   Emesis    HPI Alexandria Gross is a 18 y.o. female.   Patient presents with generalized abdominal pain and vomiting for approximately 2 weeks.  Patient did give birth 2 weeks ago and was diagnosed with gallstones while she was there.  Patient was not given any GI follow-up for further evaluation of this.  Patient was also not prescribed any medications to help with her symptoms.  Patient reports that the pain is so severe at times that it feels like she is in labor again.  Patient states that she has not been able to keep anything down over the last 2 weeks because of the nausea and vomiting.  Denies fever, body aches, and chills.  The history is provided by the patient and medical records.  Abdominal Pain Associated symptoms: vomiting   Emesis Associated symptoms: abdominal pain     Past Medical History:  Diagnosis Date   Anxiety    UTI (urinary tract infection) during pregnancy     Patient Active Problem List   Diagnosis Date Noted   IDA (iron  deficiency anemia) 03/21/2024   Cholestasis of pregnancy--new dx 03/20/24 03/20/2024   Normal labor 03/19/2024   SVD (spontaneous vaginal delivery) 03/19/2024   Postpartum care following vaginal delivery 11/7 03/19/2024    Past Surgical History:  Procedure Laterality Date   NO PAST SURGERIES      OB History     Gravida  1   Para  1   Term  1   Preterm      AB      Living  1      SAB      IAB      Ectopic      Multiple  0   Live Births  1            Home Medications    Prior to Admission medications   Medication Sig Start Date End Date Taking? Authorizing Provider  acetaminophen  (TYLENOL ) 500 MG tablet Take 2 tablets (1,000 mg total) by mouth every 6 (six) hours as needed for moderate pain (pain score 4-6) or mild pain (pain  score 1-3) (for pain scale < 4  OR  temperature  >/=  100.5 F). 12/11/23   Dillard, Naima, MD  Cholecalciferol (VITAMIN D3) 50 MCG (2000 UT) capsule Take 4,000 Units by mouth daily. Patient not taking: Reported on 04/02/2024 08/15/23   [provider]  ibuprofen  (ADVIL ) 600 MG tablet Take 1 tablet (600 mg total) by mouth every 6 (six) hours. Patient not taking: Reported on 04/02/2024 03/21/24   Montana , Jade, FNP  iron  polysaccharides (NIFEREX) 150 MG capsule Take 1 capsule (150 mg total) by mouth daily. 03/21/24   [place] , Jade, FNP  Prenatal Vit-Fe Fumarate-FA (PRENATAL MULTIVITAMIN) TABS tablet Take 1 tablet by mouth daily at 12 noon. Patient not taking: Reported on 04/02/2024 12/11/23   Armond Cape, MD    Family History Family History  Problem Relation Age of Onset   Cancer Maternal Grandfather    Diabetes Maternal Grandfather     Social History Social History   Tobacco Use   Smoking status: Never    Passive exposure: Current   Smokeless tobacco: Never  Vaping Use   Vaping status: Never Used  Substance Use Topics  Alcohol use: No   Drug use: Never     Allergies   Patient has no known allergies.   Review of Systems Review of Systems  Gastrointestinal:  Positive for abdominal pain and vomiting.   Per HPI  Physical Exam Triage Vital Signs ED Triage Vitals [04/02/24 2002]  Encounter Vitals Group     BP 108/71     Girls Systolic BP Percentile      Girls Diastolic BP Percentile      Boys Systolic BP Percentile      Boys Diastolic BP Percentile      Pulse Rate 73     Resp 14     Temp 98.2 F (36.8 C)     Temp Source Oral     SpO2 95 %     Weight      Height      Head Circumference      Peak Flow      Pain Score 7     Pain Loc      Pain Education      Exclude from Growth Chart    No data found.  Updated Vital Signs BP 108/71 (BP Location: Right Arm)   Pulse 73   Temp 98.2 F (36.8 C) (Oral)   Resp 14   LMP 04/18/2023 (Approximate)   SpO2  95%   Visual Acuity Right Eye Distance:   Left Eye Distance:   Bilateral Distance:    Right Eye Near:   Left Eye Near:    Bilateral Near:     Physical Exam Vitals and nursing note reviewed.  Constitutional:      General: She is awake. She is not in acute distress.    Appearance: Normal appearance. She is well-developed and well-groomed. She is ill-appearing. She is not toxic-appearing or diaphoretic.  Cardiovascular:     Rate and Rhythm: Normal rate and regular rhythm.  Pulmonary:     Effort: Pulmonary effort is normal.     Breath sounds: Normal breath sounds.  Abdominal:     General: Abdomen is flat. Bowel sounds are normal.     Palpations: Abdomen is soft.     Tenderness: There is generalized abdominal tenderness and tenderness in the right upper quadrant and right lower quadrant. There is guarding. Positive signs include Murphy's sign.     Comments: Tenderness to generalized abdomen particularly to right upper and lower quadrants with mild guarding present.  Skin:    General: Skin is warm and dry.  Neurological:     Mental Status: She is alert.  Psychiatric:        Behavior: Behavior is cooperative.      UC Treatments / Results  Labs (all labs ordered are listed, but only abnormal results are displayed) Labs Reviewed  POCT URINE DIPSTICK - Abnormal; Notable for the following components:      Result Value   Clarity, UA cloudy (*)    Ketones, POC UA small (15) (*)    Blood, UA moderate (*)    POC PROTEIN,UA =30 (*)    Leukocytes, UA Small (1+) (*)    All other components within normal limits  POCT URINE PREGNANCY    EKG   Radiology No results found.  Procedures Procedures (including critical care time)  Medications Ordered in UC Medications - No data to display  Initial Impression / Assessment and Plan / UC Course  I have reviewed the triage vital signs and the nursing notes.  Pertinent labs & imaging results that  were available during my care of the  patient were reviewed by me and considered in my medical decision making (see chart for details).     Patient is mildly ill-appearing.  Tenderness noted to generalized abdomen particularly to right upper and lower quadrants with mild guarding present.  No other significant findings on exam.  Urinalysis does reveal signs of dehydration.  UPT negative.  Recommended patient be seen in the emergency department for further evaluation due to severity of pain and ongoing symptoms for 2 weeks.  Patient and mother understanding and agreeable to plan at this time.  Patient is stable to arrive to the ER via POV with mother. Final Clinical Impressions(s) / UC Diagnoses   Final diagnoses:  Abdominal pain, unspecified abdominal location  Nausea and vomiting, unspecified vomiting type     Discharge Instructions      Please go to the emergency department for further evaluation.   ED Prescriptions   None    PDMP not reviewed this encounter.   Johnie Flaming A, NP 04/02/24 2047

## 2024-04-02 NOTE — ED Triage Notes (Signed)
 Patient c/o Generalized abdominal pain x 2 weeks. Patient was seen at [facility] and recomeded to be seen for further workup. Patient states she gave birth 2 weeks ago. Patient report unable to keep anything down in 2 weeks. Patient report nausea and vomiting x 2 today, denies diarrhea. Patient denies fever at home.

## 2024-04-02 NOTE — ED Provider Notes (Signed)
 [place] EMERGENCY DEPARTMENT AT [facility] Provider Note   CSN: [id number] Arrival date & time: 04/02/24  2105     Patient presents with: Abdominal Pain   Alexandria Gross is a 18 y.o. female.   HPI     18 year old patient comes into the emergency room with chief complaint of abdominal pain. Patient is about 12 days postpartum, she had NSVD.  During the delivery, she was found to have elevated LFTs and was found to have cholelithiasis.  Patient reports that ever since the discharge, she continues to have abdominal pain.  However, she has a worsening of her abdominal pain after food intake.  Pain is generalized.  Pain is usually worse with greasy food.  Patient denies any recent vomiting.  She denies any fevers, chills.  Vaginal bleeding has improved.  Prior to Admission medications   Medication Sig Start Date End Date Taking? Authorizing Provider  acetaminophen  (TYLENOL ) 500 MG tablet Take 1 tablet (500 mg total) by mouth every 6 (six) hours as needed. 04/02/24  Yes Charlyn Sora, MD  ondansetron  (ZOFRAN -ODT) 8 MG disintegrating tablet Take 1 tablet (8 mg total) by mouth every 8 (eight) hours as needed for nausea. 04/02/24  Yes Charlyn Sora, MD  Cholecalciferol (VITAMIN D3) 50 MCG (2000 UT) capsule Take 4,000 Units by mouth daily. Patient not taking: Reported on 04/02/2024 08/15/23   [provider]  ibuprofen  (ADVIL ) 600 MG tablet Take 1 tablet (600 mg total) by mouth every 6 (six) hours. Patient not taking: Reported on 04/02/2024 03/21/24   Montana , Jade, FNP  iron  polysaccharides (NIFEREX) 150 MG capsule Take 1 capsule (150 mg total) by mouth daily. 03/21/24   [place] , Jade, FNP  Prenatal Vit-Fe Fumarate-FA (PRENATAL MULTIVITAMIN) TABS tablet Take 1 tablet by mouth daily at 12 noon. Patient not taking: Reported on 04/02/2024 12/11/23   Armond Cape, MD    Allergies: Patient has no known allergies.    Review of Systems  All other systems reviewed and  are negative.   Updated Vital Signs BP 102/79   Pulse 78   Temp 97.9 F (36.6 C) (Oral)   Resp 16   LMP 04/18/2023 (Approximate)   SpO2 97%   Physical Exam Vitals and nursing note reviewed.  Constitutional:      Appearance: She is well-developed.  HENT:     Head: Atraumatic.  Cardiovascular:     Rate and Rhythm: Normal rate.  Pulmonary:     Effort: Pulmonary effort is normal.  Abdominal:     Tenderness: There is generalized abdominal tenderness. Negative signs include Murphy's sign and McBurney's sign.     Comments: Abdomen is soft, patient has generalized tenderness which is worse in the left quadrant  Musculoskeletal:     Cervical back: Normal range of motion and neck supple.  Skin:    General: Skin is warm and dry.  Neurological:     Mental Status: She is alert and oriented to person, place, and time.     (all labs ordered are listed, but only abnormal results are displayed) Labs Reviewed  COMPREHENSIVE METABOLIC PANEL WITH GFR - Abnormal; Notable for the following components:      Result Value   ALT 54 (*)    All other components within normal limits  CBC - Abnormal; Notable for the following components:   Hemoglobin 10.4 (*)    HCT 34.3 (*)    MCH 25.6 (*)    RDW 18.3 (*)    Platelets 447 (*)  All other components within normal limits  LIPASE, BLOOD  URINALYSIS, ROUTINE W REFLEX MICROSCOPIC    EKG: None  Radiology: US  Abdomen Limited RUQ (LIVER/GB) Result Date: 04/02/2024 EXAM: Right Upper Quadrant Abdominal Ultrasound 04/02/2024 10:00:53 PM TECHNIQUE: Real-time ultrasonography of the right upper quadrant of the abdomen was performed. COMPARISON: MRI 03/21/2024 CLINICAL HISTORY: RUQ pain FINDINGS: LIVER: The liver demonstrates normal echogenicity. No intrahepatic biliary ductal dilatation. No evidence of mass. BILIARY SYSTEM: No pericholecystic fluid or wall thickening. Gallstones filled the gallbladder. Common bile duct is within normal limits  measuring 6 mm. RIGHT KIDNEY: The right kidney is grossly unremarkable in appearances without evidence of hydronephrosis, echogenic calculi or worrisome mass lesions. PANCREAS: Visualized portions of the pancreas are unremarkable. OTHER: No right upper quadrant ascites. IMPRESSION: 1. Cholelithiasis, concordant with right upper quadrant pain. 2. Common bile duct measures 6 mm, within normal limits for age if applicable; no specific biliary ductal dilatation identified. Electronically signed by: Franky Crease MD 04/02/2024 10:06 PM EST RP Workstation: [id number]     Procedures   Medications Ordered in the ED - No data to display                                  Medical Decision Making Amount and/or Complexity of Data Reviewed Labs: ordered. Radiology: ordered.  Risk OTC drugs. Prescription drug management.   18 year old patient comes in with chief complaint of abdominal pain.  She is less than 2 weeks postpartum.  During her delivery, which was uneventful SVD, she was found to have gallstones as well.  No worsening bleeding, no fevers or chills.  Pain is usually postprandial.  Differential diagnosis for this patient includes gallstones, postpartum preeclampsia with hellp syndrome, cholecystitis, choledocholithiasis, pancreatitis.  Patient's exam is overall reassuring.  She has generalized tenderness, it appears to be worse on the left side.  However pain is more typical of gallstone pain as it is postprandial, and it is worse with greasy food.  Exam does not show was concerns for peritonitis.  I did consider endometritis in the differential, however patient has no fevers and her exam is reassuring.  Plan is to get basic labs and will get ultrasound right upper quadrant. If no cholecystitis, and the labs are reassuring then we will likely discuss outpatient surgery management.  Reassessment: Ultrasound is reassuring.  Labs are normal.  LFTs actually have improved compared to previous  draw.  Patient's repeat abdominal exam is unchanged.  I discussed with the patient and mother that at the moment, the exam and the lab findings are not showing concerns for acute surgical issue, and the CT scan will likely lead to unnecessary workup and radiation harm for the patient.  If they were amenable, I would prefer to wait and watch approach with outpatient follow-up with general surgery.  Patient and the mother are okay with that plan.  Gallbladder diet will be provided.  Return precautions have been discussed.   Final diagnoses:  Calculus of gallbladder without cholecystitis without obstruction    ED Discharge Orders          Ordered    ondansetron  (ZOFRAN -ODT) 8 MG disintegrating tablet  Every 8 hours PRN        04/02/24 2251    acetaminophen  (TYLENOL ) 500 MG tablet  Every 6 hours PRN        04/02/24 2251  Charlyn Sora, MD 04/02/24 2340

## 2024-04-02 NOTE — ED Notes (Signed)
 Patient is being discharged from the Urgent Care and sent to the Emergency Department via POC . Per Rumaldo, NP, patient is in need of higher level of care due to abdominal pain and vomiting. Patient is aware and verbalizes understanding of plan of care.  Vitals:   04/02/24 2002  BP: 108/71  Pulse: 73  Resp: 14  Temp: 98.2 F (36.8 C)  SpO2: 95%

## 2024-04-03 ENCOUNTER — Telehealth (HOSPITAL_COMMUNITY): Payer: Self-pay

## 2024-04-03 NOTE — Telephone Encounter (Signed)
 04/03/2024 1653  Name: Alexandria Gross MRN: [id number] DOB: July 16, 2005  Reason for Call:  Transition of Care Hospital Discharge Call  Contact Status: Patient Contact Status: Message  Language assistant needed:          Follow-Up Questions:    Van Postnatal Depression Scale:  In the Past 7 Days:    PHQ2-9 Depression Scale:     Discharge Follow-up:    Post-discharge interventions: NA  Signature  Rosaline Deretha PEAK

## 2024-04-14 ENCOUNTER — Ambulatory Visit

## 2024-04-27 ENCOUNTER — Emergency Department (HOSPITAL_COMMUNITY)

## 2024-04-27 ENCOUNTER — Emergency Department (HOSPITAL_COMMUNITY)
Admission: EM | Admit: 2024-04-27 | Discharge: 2024-04-28 | Disposition: A | Attending: Emergency Medicine | Admitting: Emergency Medicine

## 2024-04-27 ENCOUNTER — Encounter (HOSPITAL_COMMUNITY): Payer: Self-pay | Admitting: Emergency Medicine

## 2024-04-27 ENCOUNTER — Other Ambulatory Visit: Payer: Self-pay

## 2024-04-27 DIAGNOSIS — N939 Abnormal uterine and vaginal bleeding, unspecified: Secondary | ICD-10-CM | POA: Diagnosis present

## 2024-04-27 DIAGNOSIS — R1032 Left lower quadrant pain: Secondary | ICD-10-CM | POA: Diagnosis not present

## 2024-04-27 DIAGNOSIS — R197 Diarrhea, unspecified: Secondary | ICD-10-CM | POA: Diagnosis not present

## 2024-04-27 DIAGNOSIS — R112 Nausea with vomiting, unspecified: Secondary | ICD-10-CM | POA: Diagnosis not present

## 2024-04-27 DIAGNOSIS — R1031 Right lower quadrant pain: Secondary | ICD-10-CM | POA: Diagnosis not present

## 2024-04-27 DIAGNOSIS — R1011 Right upper quadrant pain: Secondary | ICD-10-CM | POA: Diagnosis not present

## 2024-04-27 DIAGNOSIS — R1024 Suprapubic pain: Secondary | ICD-10-CM | POA: Diagnosis not present

## 2024-04-27 LAB — COMPREHENSIVE METABOLIC PANEL WITH GFR
ALT: 28 U/L (ref 0–44)
AST: 35 U/L (ref 15–41)
Albumin: 4 g/dL (ref 3.5–5.0)
Alkaline Phosphatase: 65 U/L (ref 38–126)
Anion gap: 9 (ref 5–15)
BUN: 6 mg/dL (ref 6–20)
CO2: 27 mmol/L (ref 22–32)
Calcium: 9.6 mg/dL (ref 8.9–10.3)
Chloride: 106 mmol/L (ref 98–111)
Creatinine, Ser: 0.45 mg/dL (ref 0.44–1.00)
GFR, Estimated: >60 mL/min (ref >60)
Glucose, Bld: 98 mg/dL (ref 70–99)
Potassium: 4 mmol/L (ref 3.5–5.1)
Sodium: 143 mmol/L (ref 135–145)
Total Bilirubin: <0.2 mg/dL (ref 0.0–1.2)
Total Protein: 7.1 g/dL (ref 6.5–8.1)

## 2024-04-27 LAB — CBC WITH DIFFERENTIAL/PLATELET
Abs Immature Granulocytes: 0.01 K/uL (ref 0.00–0.07)
Basophils Absolute: 0 K/uL (ref 0.0–0.1)
Basophils Relative: 1 %
Eosinophils Absolute: 0.5 K/uL (ref 0.0–0.5)
Eosinophils Relative: 8 %
HCT: 35.9 % — ABNORMAL LOW (ref 36.0–46.0)
Hemoglobin: 11 g/dL — ABNORMAL LOW (ref 12.0–15.0)
Immature Granulocytes: 0 %
Lymphocytes Relative: 35 %
Lymphs Abs: 2 K/uL (ref 0.7–4.0)
MCH: 25.9 pg — ABNORMAL LOW (ref 26.0–34.0)
MCHC: 30.6 g/dL (ref 30.0–36.0)
MCV: 84.7 fL (ref 80.0–100.0)
Monocytes Absolute: 0.4 K/uL (ref 0.1–1.0)
Monocytes Relative: 7 %
Neutro Abs: 2.8 K/uL (ref 1.7–7.7)
Neutrophils Relative %: 49 %
Platelets: 281 K/uL (ref 150–400)
RBC: 4.24 MIL/uL (ref 3.87–5.11)
RDW: 18.4 % — ABNORMAL HIGH (ref 11.5–15.5)
WBC: 5.8 K/uL (ref 4.0–10.5)
nRBC: 0 % (ref 0.0–0.2)

## 2024-04-27 LAB — HCG, QUANTITATIVE, PREGNANCY: hCG, Beta Chain, Quant, S: <1 m[IU]/mL (ref <5)

## 2024-04-27 LAB — LIPASE, BLOOD: Lipase: 40 U/L (ref 11–51)

## 2024-04-27 MED ORDER — SODIUM CHLORIDE 0.9 % IV BOLUS
1000.0000 mL | Freq: Once | INTRAVENOUS | Status: AC
  Administered 2024-04-27: 23:00:00 1000 mL via INTRAVENOUS

## 2024-04-27 MED ORDER — ONDANSETRON HCL 4 MG/2ML IJ SOLN
4.0000 mg | Freq: Once | INTRAMUSCULAR | Status: AC
  Administered 2024-04-27: 23:00:00 4 mg via INTRAVENOUS
  Filled 2024-04-27: qty 2

## 2024-04-27 MED ORDER — MEGESTROL ACETATE 20 MG PO TABS: 20.0000 mg | ORAL_TABLET | Freq: Every day | ORAL | 0 refills | Status: AC

## 2024-04-27 MED ORDER — IOHEXOL 300 MG/ML  SOLN
100.0000 mL | Freq: Once | INTRAMUSCULAR | Status: AC | PRN
  Administered 2024-04-28: 100 mL via INTRAVENOUS

## 2024-04-27 MED ORDER — MORPHINE SULFATE (PF) 4 MG/ML IV SOLN
4.0000 mg | Freq: Once | INTRAVENOUS | Status: AC
  Administered 2024-04-27: 23:00:00 4 mg via INTRAVENOUS
  Filled 2024-04-27: qty 1

## 2024-04-27 NOTE — ED Provider Triage Note (Signed)
 Emergency Medicine Provider Triage Evaluation Note  Alexandria Gross , a 18 y.o. female  was evaluated in triage.  Pt complains of vaginal bleeding. Started 5 days ago. Bleeding with clots and lower abdominal pain. Gave birth 6 weeks ago.   Review of Systems  Positive: See above Negative: See above  Physical Exam  BP 107/70 (BP Location: Left Arm)   Pulse 92   Temp 98 F (36.7 C) (Oral)   Resp 15   LMP 04/18/2023 (Approximate)   SpO2 98%   Breastfeeding Yes  Gen:   Awake, no distress   Resp:  Normal effort  MSK:   Moves extremities without difficulty  Other:    Medical Decision Making  Medically screening exam initiated at 7:40 PM.  Appropriate orders placed.  Lyle LITTIE Haff was informed that the remainder of the evaluation will be completed by another provider, this initial triage assessment does not replace that evaluation, and the importance of remaining in the ED until their evaluation is complete.  Work up started   Lang Norleen POUR, PA-C 04/27/24 1941

## 2024-04-27 NOTE — ED Notes (Signed)
 First poc with patient. PT sitting up in bed with mother at bedside.  PT axox4. GCS 15. PT co lower abdominal pain radiating to RUQ for few days. C/o heavy vaginal bleeding since vaginal birth x6 weeks ago.  PT c/o n/v as well.  No respiratory distress noted.  Awaiting CT scan.  Pt and mother of patient updated on plan of care

## 2024-04-27 NOTE — ED Triage Notes (Signed)
 Patient coming to ED for evaluation of abdominal pain and vaginal bleeding.  Reports she gave birth on 03/19/24 without complications.  For the past few days she has been having weakness, shortness of breath, dizziness, vomiting, and worsening vaginal bleeding with clots.  Was seen after delivery and dx with gallstones.  States symptoms are different than when she was seen with gallstones.

## 2024-04-27 NOTE — Discharge Instructions (Addendum)
 Evaluation for your abdominal pain and vaginal bleeding were overall reassuring.  As we discussed would recommend 3 to 4 days of ibuprofen  and then if your bleeding does not subside would recommend Megace  which I have sent to your pharmacy.  If you are soaking 1 pad per hour become short of breath, fatigue or passout or any other concerning symptom please return to ED for further evaluation.  Otherwise please follow-up with your OB/GYN provider for vaginal bleeding and general surgery for known gallstones.

## 2024-04-27 NOTE — ED Notes (Signed)
 Patient returned from CT scan now c/o chest pain. Notified provider via secure chat.  Received orders to perform EKG

## 2024-04-27 NOTE — ED Provider Notes (Cosign Needed)
 [place] EMERGENCY DEPARTMENT AT [facility] Provider Note   CSN: [id number] Arrival date & time: 04/27/24  1919     Patient presents with: Abdominal Pain  HPI Alexandria Gross is a 18 y.o. female presenting for vaginal bleeding.  Started 5 days ago. Bleeding with clots and lower abdominal pain. Gave birth 6 weeks ago.  Also recent diagnosis of gallstones but states her abdominal pain is lower and this pain is different.  Has not been taking anything for her vaginal bleeding.  However also reports that she has been vomiting and having diarrhea and nausea for the last 4 days as well.   Abdominal Pain      Prior to Admission medications  Medication Sig Start Date End Date Taking? Authorizing Provider  megestrol  (MEGACE ) 20 MG tablet Take 1 tablet (20 mg total) by mouth daily for 7 days. 04/27/24 05/04/24 Yes Elyna Pangilinan K, PA-C  acetaminophen  (TYLENOL ) 500 MG tablet Take 1 tablet (500 mg total) by mouth every 6 (six) hours as needed. 04/02/24   Charlyn Sora, MD  Cholecalciferol (VITAMIN D3) 50 MCG (2000 UT) capsule Take 4,000 Units by mouth daily. Patient not taking: Reported on 04/02/2024 08/15/23   [provider]  ibuprofen  (ADVIL ) 600 MG tablet Take 1 tablet (600 mg total) by mouth every 6 (six) hours. Patient not taking: Reported on 04/02/2024 03/21/24   [place] , Jade, FNP  iron  polysaccharides (NIFEREX) 150 MG capsule Take 1 capsule (150 mg total) by mouth daily. 03/21/24   [place] , Jade, FNP  ondansetron  (ZOFRAN -ODT) 8 MG disintegrating tablet Take 1 tablet (8 mg total) by mouth every 8 (eight) hours as needed for nausea. 04/02/24   Charlyn Sora, MD  Prenatal Vit-Fe Fumarate-FA (PRENATAL MULTIVITAMIN) TABS tablet Take 1 tablet by mouth daily at 12 noon. Patient not taking: Reported on 04/02/2024 12/11/23   Armond Cape, MD    Allergies: Patient has no known allergies.    Review of Systems  Gastrointestinal:  Positive for abdominal pain.     Updated Vital Signs BP 116/82 (BP Location: Right Arm)   Pulse 70   Temp (!) 97.5 F (36.4 C) (Oral)   Resp 18   Ht 5' 3 (1.6 m)   Wt 64.9 kg   LMP 04/18/2023 (Approximate) Comment: negative HCG 04/26/24  SpO2 97%   Breastfeeding Yes   BMI 25.35 kg/m   Physical Exam Vitals and nursing note reviewed.  HENT:     Head: Normocephalic and atraumatic.     Mouth/Throat:     Mouth: Mucous membranes are moist.  Eyes:     General:        Right eye: No discharge.        Left eye: No discharge.     Conjunctiva/sclera: Conjunctivae normal.  Cardiovascular:     Rate and Rhythm: Normal rate and regular rhythm.     Pulses: Normal pulses.     Heart sounds: Normal heart sounds.  Pulmonary:     Effort: Pulmonary effort is normal.     Breath sounds: Normal breath sounds.  Abdominal:     General: Abdomen is flat.     Palpations: Abdomen is soft.     Tenderness: There is abdominal tenderness in the right upper quadrant, right lower quadrant, suprapubic area and left lower quadrant.  Skin:    General: Skin is warm and dry.  Neurological:     General: No focal deficit present.  Psychiatric:        Mood and Affect:  Mood normal.     (all labs ordered are listed, but only abnormal results are displayed) Labs Reviewed  CBC WITH DIFFERENTIAL/PLATELET - Abnormal; Notable for the following components:      Result Value   Hemoglobin 11.0 (*)    HCT 35.9 (*)    MCH 25.9 (*)    RDW 18.4 (*)    All other components within normal limits  COMPREHENSIVE METABOLIC PANEL WITH GFR  LIPASE, BLOOD  HCG, QUANTITATIVE, PREGNANCY    EKG: EKG Interpretation Date/Time:  Tuesday April 27 2024 23:30:30 EST Ventricular Rate:  80 PR Interval:  146 QRS Duration:  97 QT Interval:  415 QTC Calculation: 479 R Axis:   80  Text Interpretation: Sinus rhythm Borderline T abnormalities, diffuse leads Borderline prolonged QT interval Confirmed by Darra Chew [phone]) on 04/27/2024 11:32:12  PM  Radiology: No results found.   Procedures   Medications Ordered in the ED  iohexol  (OMNIPAQUE ) 300 MG/ML solution 100 mL (has no administration in time range)  sodium chloride  0.9 % bolus 1,000 mL (1,000 mLs Intravenous New Bag/Given 04/27/24 2237)  ondansetron  (ZOFRAN ) injection 4 mg (4 mg Intravenous Given 04/27/24 2237)  morphine  (PF) 4 MG/ML injection 4 mg (4 mg Intravenous Given 04/27/24 2236)    Clinical Course as of 04/27/24 2355  Tue Apr 27, 2024  2337 F/u on CT --> if unremarkable DC with gen surg and OBGYN for her vaginal bleeding.  [CG]  2339 PO fluid challenge [CG]    Clinical Course User Index [CG] Ruthell Lonni FALCON, PA-C                                 Medical Decision Making Amount and/or Complexity of Data Reviewed Labs: ordered. Radiology: ordered.  Risk Prescription drug management.   Initial Impression and Ddx 18 year old well-appearing female presenting for abdominal pain.  Exam notable for right upper quadrant lower abdominal pain.  DDx includes acute cholecystitis, ectopic pregnancy, ovarian cyst rupture, appendicitis, acute cholecystitis, symptomatic anemia, electrolyte derangement, other. Patient PMH that increases complexity of ED encounter:  recent h/o gall stones, recent birth  Interpretation of Diagnostics - I independent reviewed and interpreted the labs as followed: Hgb 11.0 (uptrending)  CT ab/pelvis is pending  Patient Reassessment and Ultimate Disposition/Management Remained hemoglobin stable.  Pain improved somewhat with morphine .  CT scan is pending at this time.  Discussed with patient that for vaginal bleeding would recommend 3 to 4 days of ibuprofen  at home and then try Megace  if no improvement.  Otherwise follow-up with OB/GYN and did discuss pertinent return precautions should her bleeding get worse.  They reviewed her chart and noted that she had abdominal MRCP which showed stone filled gallbladder without acute cholecystitis  last month.  Patient does not have a fever or white count and pain is well-controlled at this time.  Patient does have follow-up with general surgery.  If CT is unremarkable we will advised her to follow-up with general surgery.  Signed out patient to PA Lonni Ruthell.  Anticipate discharge if CT is unremarkable can fluid challenge and remains hemodynamically stable.  Patient management required discussion with the following services or consulting groups:  None  Complexity of Problems Addressed Acute complicated illness or Injury  Additional Data Reviewed and Analyzed Further history obtained from: Past medical history and medications listed in the EMR and Prior ED visit notes  Patient Encounter Risk Assessment Consideration of hospitalization  Final diagnoses:  Vaginal bleeding    ED Discharge Orders          Ordered    megestrol  (MEGACE ) 20 MG tablet  Daily        04/27/24 2350               Lang Norleen POUR, PA-C 04/27/24 2356

## 2024-04-28 LAB — TROPONIN T, HIGH SENSITIVITY: Troponin T High Sensitivity: <15 ng/L (ref 0–19)

## 2024-04-28 MED ORDER — ONDANSETRON 4 MG PO TBDP: 4.0000 mg | ORAL_TABLET | Freq: Three times a day (TID) | ORAL | 0 refills | Status: AC | PRN

## 2024-04-28 NOTE — ED Notes (Signed)
 PT axox4. GCS 15. PT and mother of patient verbalize understanding of discharge instructions, follow up and new rx. Pt ambulated out of er with steady gait to transportation home with mother

## 2024-04-28 NOTE — ED Provider Notes (Signed)
°  Physical Exam  BP 105/70   Pulse 76   Temp (!) 97.5 F (36.4 C) (Oral)   Resp 17   Ht 5' 3 (1.6 m)   Wt 64.9 kg   LMP 04/18/2023 (Approximate) Comment: negative HCG 04/26/24  SpO2 98%   Breastfeeding Yes   BMI 25.35 kg/m   Physical Exam Vitals and nursing note reviewed.  Constitutional:      General: She is not in acute distress.    Appearance: She is not toxic-appearing.  HENT:     Head: Normocephalic and atraumatic.  Pulmonary:     Effort: No respiratory distress.  Skin:    Coloration: Skin is not jaundiced or pale.  Neurological:     Mental Status: She is alert and oriented to person, place, and time.  Psychiatric:        Behavior: Behavior normal.     Procedures  Procedures  ED Course / MDM   Clinical Course as of 04/28/24 0239  Tue Apr 27, 2024  2337 F/u on CT --> if unremarkable DC with gen surg and OBGYN for her vaginal bleeding.  [CG]  2339 PO fluid challenge [CG]    Clinical Course User Index [CG] Ruthell Lonni FALCON, PA-C   Medical Decision Making Amount and/or Complexity of Data Reviewed Labs: ordered. Radiology: ordered.  Risk Prescription drug management.   18 year old female signed out to me at shift change pending imaging, reevaluation, p.o. fluid challenge.  In short, 18 year old female coming in complaining of vaginal bleeding, abdominal pain.  Patient recently pregnant.  Patient was found to have increased hemoglobin from baseline, previous provider did send patient with Megace .  Patient reports to me she does not have consistent and reliable follow-up with OB/GYN so we will refer patient to [facility].  For her abdominal pain, reports longstanding history of cholelithiasis, biliary colic.  The patient CT scan here shows cholelithiasis with common bile duct dilation to 10 mm which is slightly increased from prior imaging studies however the patient LFTs, bilirubin here are normal.  Her pain was well-controlled with 4 mg of  morphine .  She passed p.o. fluid challenge.  She did start complaining of chest pain after morphine  was administered however the patient chest pain was reproducible and she had EKG which was nonischemic as well as troponin which was undetectable.  She has no tachycardia, tachypnea, history of DVT or PE.  Appears well. Doubt PE. Patient will be discharged home at this time with Zofran  for nausea, Megace  by previous provider for vaginal bleeding, OBGYN referral for follow up.  Patient states that she is established with general surgery and GI so patient was advised to follow-up with these services.  Will send patient with referral to OB/GYN.  Patient given return precautions and she voiced understanding.  Stable to discharge.       Ruthell Lonni FALCON, PA-C 04/28/24 0239    Trine Raynell Moder, MD 04/28/24 [phone]

## 2024-05-26 ENCOUNTER — Emergency Department (HOSPITAL_COMMUNITY)

## 2024-05-26 ENCOUNTER — Emergency Department (HOSPITAL_COMMUNITY): Admission: EM | Admit: 2024-05-26 | Discharge: 2024-05-26 | Disposition: A | Discharge status: Home / Self Care

## 2024-05-26 DIAGNOSIS — K8020 Calculus of gallbladder without cholecystitis without obstruction: Secondary | ICD-10-CM | POA: Diagnosis not present

## 2024-05-26 DIAGNOSIS — R1011 Right upper quadrant pain: Secondary | ICD-10-CM | POA: Diagnosis present

## 2024-05-26 LAB — COMPREHENSIVE METABOLIC PANEL WITH GFR
ALT: 57 U/L — ABNORMAL HIGH (ref 0–44)
AST: 90 U/L — ABNORMAL HIGH (ref 15–41)
Albumin: 4 g/dL (ref 3.5–5.0)
Alkaline Phosphatase: 54 U/L (ref 38–126)
Anion gap: 9 (ref 5–15)
BUN: 5 mg/dL — ABNORMAL LOW (ref 6–20)
CO2: 25 mmol/L (ref 22–32)
Calcium: 9.4 mg/dL (ref 8.9–10.3)
Chloride: 104 mmol/L (ref 98–111)
Creatinine, Ser: 0.48 mg/dL (ref 0.44–1.00)
GFR, Estimated: >60 mL/min
Glucose, Bld: 88 mg/dL (ref 70–99)
Potassium: 4 mmol/L (ref 3.5–5.1)
Sodium: 138 mmol/L (ref 135–145)
Total Bilirubin: 0.2 mg/dL (ref 0.0–1.2)
Total Protein: 7 g/dL (ref 6.5–8.1)

## 2024-05-26 LAB — CBC
HCT: 35.7 % — ABNORMAL LOW (ref 36.0–46.0)
Hemoglobin: 11.4 g/dL — ABNORMAL LOW (ref 12.0–15.0)
MCH: 27.7 pg (ref 26.0–34.0)
MCHC: 31.9 g/dL (ref 30.0–36.0)
MCV: 86.9 fL (ref 80.0–100.0)
Platelets: 282 K/uL (ref 150–400)
RBC: 4.11 MIL/uL (ref 3.87–5.11)
RDW: 17.4 % — ABNORMAL HIGH (ref 11.5–15.5)
WBC: 7 K/uL (ref 4.0–10.5)
nRBC: 0 % (ref 0.0–0.2)

## 2024-05-26 LAB — HCG, SERUM, QUALITATIVE: Preg, Serum: NEGATIVE

## 2024-05-26 LAB — LIPASE, BLOOD: Lipase: 26 U/L (ref 11–51)

## 2024-05-26 MED ORDER — ONDANSETRON HCL 4 MG/2ML IJ SOLN
4.0000 mg | Freq: Once | INTRAMUSCULAR | Status: AC
  Administered 2024-05-26: 4 mg via INTRAVENOUS
  Filled 2024-05-26: qty 2

## 2024-05-26 MED ORDER — SUCRALFATE 1 G PO TABS: 1.0000 g | ORAL_TABLET | Freq: Three times a day (TID) | ORAL | 0 refills | Status: AC

## 2024-05-26 MED ORDER — ONDANSETRON HCL 4 MG PO TABS: 4.0000 mg | ORAL_TABLET | Freq: Three times a day (TID) | ORAL | 0 refills | Status: AC | PRN

## 2024-05-26 MED ORDER — HYDROCODONE-ACETAMINOPHEN 5-325 MG PO TABS: 2.0000 | ORAL_TABLET | ORAL | 0 refills | Status: AC | PRN

## 2024-05-26 MED ORDER — KETOROLAC TROMETHAMINE 15 MG/ML IJ SOLN
15.0000 mg | Freq: Once | INTRAMUSCULAR | Status: AC
  Administered 2024-05-26: 15 mg via INTRAVENOUS
  Filled 2024-05-26: qty 1

## 2024-05-26 MED ORDER — LACTATED RINGERS IV BOLUS
1000.0000 mL | Freq: Once | INTRAVENOUS | Status: AC
  Administered 2024-05-26: 1000 mL via INTRAVENOUS

## 2024-05-26 NOTE — ED Triage Notes (Signed)
 C/o n/v, upper back pain between the shoulder blades, and dizziness. Symptoms started around 1am last night. Has surgery next month to have gallbladder removed.

## 2024-05-26 NOTE — ED Provider Notes (Signed)
 " [facility] Provider Note   CSN: [id number] Arrival date & time: 05/26/24  9197     Patient presents with: Emesis   Alexandria Gross is a 19 y.o. female.   19 year old female presents for evaluation of emesis.  Patient has a history of gallstones and since being diagnosed with this has had difficulty eating and drinking.  States she gets nauseous quite frequently.  Last night she had some increased right upper quadrant abdominal pain and nausea and vomiting.  She denies any diarrhea.  Denies any fevers or any other symptoms or concerns at this time.  She has a cholecystectomy scheduled for 2 - 20 - 25   Emesis Associated symptoms: abdominal pain   Associated symptoms: no arthralgias, no chills, no cough, no fever and no sore throat        Prior to Admission medications  Medication Sig Start Date End Date Taking? Authorizing Provider  HYDROcodone -acetaminophen  (NORCO/VICODIN) 5-325 MG tablet Take 2 tablets by mouth every 4 (four) hours as needed for up to 4 days for severe pain (pain score 7-10). 05/26/24 05/30/24 Yes Mujtaba Bollig L, DO  ondansetron  (ZOFRAN ) 4 MG tablet Take 1 tablet (4 mg total) by mouth every 8 (eight) hours as needed for up to 4 days. 05/26/24 05/30/24 Yes Ardenia Stiner L, DO  sucralfate  (CARAFATE ) 1 g tablet Take 1 tablet (1 g total) by mouth 4 (four) times daily -  with meals and at bedtime. 05/26/24 06/09/24 Yes Anterio Scheel L, DO  acetaminophen  (TYLENOL ) 500 MG tablet Take 1 tablet (500 mg total) by mouth every 6 (six) hours as needed. 04/02/24   Charlyn Sora, MD  Cholecalciferol (VITAMIN D3) 50 MCG (2000 UT) capsule Take 4,000 Units by mouth daily. Patient not taking: Reported on 04/02/2024 08/15/23   [provider]  ibuprofen  (ADVIL ) 600 MG tablet Take 1 tablet (600 mg total) by mouth every 6 (six) hours. Patient not taking: Reported on 04/02/2024 03/21/24   Montana , Jade, FNP  iron  polysaccharides  (NIFEREX) 150 MG capsule Take 1 capsule (150 mg total) by mouth daily. 03/21/24   [place] , Jade, FNP  ondansetron  (ZOFRAN -ODT) 4 MG disintegrating tablet Take 1 tablet (4 mg total) by mouth every 8 (eight) hours as needed for nausea or vomiting. 04/28/24   Ruthell Lonni FALCON, PA-C  Prenatal Vit-Fe Fumarate-FA (PRENATAL MULTIVITAMIN) TABS tablet Take 1 tablet by mouth daily at 12 noon. Patient not taking: Reported on 04/02/2024 12/11/23   Armond Cape, MD    Allergies: Patient has no known allergies.    Review of Systems  Constitutional:  Negative for chills and fever.  HENT:  Negative for ear pain and sore throat.   Eyes:  Negative for pain and visual disturbance.  Respiratory:  Negative for cough and shortness of breath.   Cardiovascular:  Negative for chest pain and palpitations.  Gastrointestinal:  Positive for abdominal pain, nausea and vomiting.  Genitourinary:  Negative for dysuria and hematuria.  Musculoskeletal:  Negative for arthralgias and back pain.  Skin:  Negative for color change and rash.  Neurological:  Negative for seizures and syncope.  All other systems reviewed and are negative.   Updated Vital Signs BP 104/72 (BP Location: Left Arm)   Pulse 76   Temp 98.1 F (36.7 C) (Oral)   Resp 16   LMP 04/18/2023 (Approximate) Comment: negative HCG 04/26/24  SpO2 98%   Physical Exam Vitals and nursing note reviewed.  Constitutional:  General: She is not in acute distress.    Appearance: Normal appearance. She is well-developed. She is not ill-appearing.  HENT:     Head: Normocephalic and atraumatic.  Eyes:     Conjunctiva/sclera: Conjunctivae normal.  Cardiovascular:     Rate and Rhythm: Normal rate and regular rhythm.     Heart sounds: No murmur heard. Pulmonary:     Effort: Pulmonary effort is normal. No respiratory distress.     Breath sounds: Normal breath sounds.  Abdominal:     General: There is no distension.     Palpations: Abdomen is soft. There  is no mass.     Tenderness: There is no abdominal tenderness.     Hernia: No hernia is present.  Musculoskeletal:        General: No swelling.     Cervical back: Neck supple.  Skin:    General: Skin is warm and dry.     Capillary Refill: Capillary refill takes less than 2 seconds.  Neurological:     General: No focal deficit present.     Mental Status: She is alert.  Psychiatric:        Mood and Affect: Mood normal.     (all labs ordered are listed, but only abnormal results are displayed) Labs Reviewed  COMPREHENSIVE METABOLIC PANEL WITH GFR - Abnormal; Notable for the following components:      Result Value   BUN 5 (*)    AST 90 (*)    ALT 57 (*)    All other components within normal limits  CBC - Abnormal; Notable for the following components:   Hemoglobin 11.4 (*)    HCT 35.7 (*)    RDW 17.4 (*)    All other components within normal limits  LIPASE, BLOOD  HCG, SERUM, QUALITATIVE    EKG: None  Radiology: US  Abdomen Limited RUQ (LIVER/GB) Result Date: 05/26/2024 CLINICAL DATA:  Right upper quadrant abdominal pain. EXAM: ULTRASOUND ABDOMEN LIMITED RIGHT UPPER QUADRANT COMPARISON:  CT dated 04/28/2024. FINDINGS: Gallbladder: Multiple gallstones with no gallbladder wall thickening or pericholecystic fluid. Negative sonographic Murphy's sign. Common bile duct: Diameter: 10 mm Liver: There is diffuse increased liver echogenicity most commonly seen in the setting of fatty infiltration. Superimposed inflammation or fibrosis is not excluded. Clinical correlation is recommended. Portal vein is patent on color Doppler imaging with normal direction of blood flow towards the liver. Other: None. IMPRESSION: 1. Cholelithiasis without sonographic evidence of acute cholecystitis. 2. Fatty liver. Electronically Signed   By: Vanetta Chou M.D.   On: 05/26/2024 13:49   DG Chest 2 View Result Date: 05/26/2024 CLINICAL DATA:  Upper back pain EXAM: CHEST - 2 VIEW COMPARISON:  July 08, 2021 FINDINGS: The heart size and mediastinal contours are within normal limits. Both lungs are clear. The visualized skeletal structures are unremarkable. IMPRESSION: No active cardiopulmonary disease. Electronically Signed   By: Lynwood Landy Raddle M.D.   On: 05/26/2024 13:02     Procedures   Medications Ordered in the ED  lactated ringers  bolus 1,000 mL (0 mLs Intravenous Stopped 05/26/24 1558)  ondansetron  (ZOFRAN ) injection 4 mg (4 mg Intravenous Given 05/26/24 1433)  ketorolac  (TORADOL ) 15 MG/ML injection 15 mg (15 mg Intravenous Given 05/26/24 1433)                                    Medical Decision Making Cardiac monitor interpretation: Sinus rhythm, no  ectopy  Patient here for gallbladder colic.  No evidence of acute cholecystitis.  She is improved after fluids Toradol  and Zofran .  I do long discussion with patient and mother at bedside about trying to get in for closer follow-up with surgery.  She is given very strict return precautions.  Will give prescription for Zofran  and Carafate  advised her to continue her Protonix.  She is educated on diet changes to and advised to follow-up with surgery and primary care.  They feel comfortable to plan for the patient be discharged home.  Problems Addressed: Gallstones: acute illness or injury  Amount and/or Complexity of Data Reviewed External Data Reviewed: notes.    Details: Outpatient records reviewed and patient has surgery scheduled about 1 month from now for cholecystectomy Labs: ordered. Decision-making details documented in ED Course.    Details: Ordered and reviewed by me and AST and ALT slightly above normal, no elevation in bilirubin, about baseline for patient Radiology: ordered and independent interpretation performed. Decision-making details documented in ED Course.    Details: Ordered and interpreted by me independently of radiology Chest x-ray: Shows no acute abnormality Right upper quadrant ultrasound shows cholelithiasis  without any signs of cholecystitis  Risk OTC drugs. Prescription drug management. Drug therapy requiring intensive monitoring for toxicity.    Final diagnoses:  Gallstones    ED Discharge Orders          Ordered    HYDROcodone -acetaminophen  (NORCO/VICODIN) 5-325 MG tablet  Every 4 hours PRN        05/26/24 1521    ondansetron  (ZOFRAN ) 4 MG tablet  Every 8 hours PRN        05/26/24 1521    sucralfate  (CARAFATE ) 1 g tablet  3 times daily with meals & bedtime        05/26/24 1521               Jennifier Smitherman, Duwaine L, DO 05/26/24 1615  "

## 2024-05-26 NOTE — Discharge Instructions (Signed)
 Call your surgery office and tried to move up your scheduled surgery appointment.  Tell me you are in the ER for gallbladder pain.  You can take Zofran  as needed for nausea and vomiting.  Continue your Protonix.  You should take your Carafate  as prescribed.  Use Norco only as needed for severe pain.  Otherwise alternate Tylenol  and Motrin  as needed for pain.

## 2024-05-26 NOTE — ED Provider Triage Note (Signed)
 Emergency Medicine Provider Triage Evaluation Note  Alexandria Gross , a 19 y.o. female  was evaluated in triage.  Pt complains of vomiting.  Patient reports sudden onset of severe pain between her shoulder blades last night with some nausea, vomiting,.  Reports that she did feel some pain rating from her right upper quadrant towards this area.  States that she has surgery scheduled for next month for cholecystectomy.  She denies any fevers, chills or bodyaches.  She denies any significant shortness of breath but does report that deep breaths makes the pain worse in the right upper quadrant.  She took a dose of hydrocodone  before arriving today and feels that pain is tolerable.  She states that the pain flared up today spontaneously and states that she was not eating or drinking at that time.  Review of Systems  Positive: As above Negative: As above  Physical Exam  BP 94/62 (BP Location: Left Arm)   Pulse 65   Temp 98.5 F (36.9 C) (Oral)   Resp 16   LMP 04/18/2023 (Approximate) Comment: negative HCG 04/26/24  SpO2 98%  Gen:   Awake, no distress  Resp:  Normal effort  MSK:   Moves extremities without difficulty  Other:  Tenderness palpation in the right upper quadrant and epigastrium.  No abnormal heart or lung sounds.  Medical Decision Making  Medically screening exam initiated at 12:16 PM.  Appropriate orders placed.  Lyle LITTIE Haff was informed that the remainder of the evaluation will be completed by another provider, this initial triage assessment does not replace that evaluation, and the importance of remaining in the ED until their evaluation is complete.    Kissie Ziolkowski A, PA-C 05/26/24 1218

## 2024-06-03 ENCOUNTER — Encounter (HOSPITAL_BASED_OUTPATIENT_CLINIC_OR_DEPARTMENT_OTHER): Payer: Self-pay

## 2024-06-03 ENCOUNTER — Ambulatory Visit (HOSPITAL_BASED_OUTPATIENT_CLINIC_OR_DEPARTMENT_OTHER)

## 2024-06-03 ENCOUNTER — Ambulatory Visit (HOSPITAL_BASED_OUTPATIENT_CLINIC_OR_DEPARTMENT_OTHER)
Admission: RE | Admit: 2024-06-03 | Discharge: 2024-06-03 | Disposition: A | Discharge status: Home / Self Care | Attending: Family Medicine | Admitting: Family Medicine

## 2024-06-03 VITALS — BP 115/78 | HR 98 | Temp 98.6°F | Resp 20

## 2024-06-03 DIAGNOSIS — J029 Acute pharyngitis, unspecified: Secondary | ICD-10-CM

## 2024-06-03 LAB — POCT RAPID STREP A (OFFICE): Rapid Strep A Screen: NEGATIVE

## 2024-06-03 NOTE — ED Provider Notes (Signed)
 " [facility]    CSN: [id number] Arrival date & time: 06/03/24  1319      History   Chief Complaint Chief Complaint  Patient presents with   Sore Throat    Entered by patient    HPI Alexandria Gross is a 19 y.o. female.   Pt c/o sore throat, chills, and body aches for the last 3 days. States she gets strep a lot throughout the year. She has taken allergy meds and tylenol  with no relief.     Sore Throat    Past Medical History:  Diagnosis Date   Anxiety    UTI (urinary tract infection) during pregnancy     Patient Active Problem List   Diagnosis Date Noted   IDA (iron  deficiency anemia) 03/21/2024   Cholestasis of pregnancy--new dx 03/20/24 03/20/2024   Normal labor 03/19/2024   SVD (spontaneous vaginal delivery) 03/19/2024   Postpartum care following vaginal delivery 11/7 03/19/2024    Past Surgical History:  Procedure Laterality Date   NO PAST SURGERIES      OB History     Gravida  1   Para  1   Term  1   Preterm      AB      Living  1      SAB      IAB      Ectopic      Multiple  0   Live Births  1            Home Medications    Prior to Admission medications  Medication Sig Start Date End Date Taking? Authorizing Provider  acetaminophen  (TYLENOL ) 500 MG tablet Take 1 tablet (500 mg total) by mouth every 6 (six) hours as needed. 04/02/24   Charlyn Sora, MD  iron  polysaccharides (NIFEREX) 150 MG capsule Take 1 capsule (150 mg total) by mouth daily. 03/21/24   [place] , Jade, FNP  ondansetron  (ZOFRAN -ODT) 4 MG disintegrating tablet Take 1 tablet (4 mg total) by mouth every 8 (eight) hours as needed for nausea or vomiting. 04/28/24   Ruthell Lonni FALCON, PA-C  sucralfate  (CARAFATE ) 1 g tablet Take 1 tablet (1 g total) by mouth 4 (four) times daily -  with meals and at bedtime. 05/26/24 06/09/24  Gennaro Duwaine LITTIE, DO    Family History Family History  Problem Relation Age of Onset   Cancer Maternal Grandfather     Diabetes Maternal Grandfather     Social History Social History[1]   Allergies   Patient has no known allergies.   Review of Systems Review of Systems  See HPI Physical Exam Triage Vital Signs ED Triage Vitals  Encounter Vitals Group     BP 06/03/24 1331 115/78     Girls Systolic BP Percentile --      Girls Diastolic BP Percentile --      Boys Systolic BP Percentile --      Boys Diastolic BP Percentile --      Pulse Rate 06/03/24 1331 98     Resp 06/03/24 1331 20     Temp 06/03/24 1331 98.6 F (37 C)     Temp Source 06/03/24 1331 Oral     SpO2 06/03/24 1331 97 %     Weight --      Height --      Head Circumference --      Peak Flow --      Pain Score 06/03/24 1329 8     Pain Loc --  Pain Education --      Exclude from Growth Chart --    No data found.  Updated Vital Signs BP 115/78 (BP Location: Right Arm)   Pulse 98   Temp 98.6 F (37 C) (Oral)   Resp 20   LMP 04/18/2023 (Approximate)   SpO2 97%   Breastfeeding Yes   Visual Acuity Right Eye Distance:   Left Eye Distance:   Bilateral Distance:    Right Eye Near:   Left Eye Near:    Bilateral Near:     Physical Exam Vitals and nursing note reviewed.  Constitutional:      General: She is not in acute distress.    Appearance: Normal appearance. She is not ill-appearing, toxic-appearing or diaphoretic.  HENT:     Nose: Congestion present.     Mouth/Throat:     Pharynx: Posterior oropharyngeal erythema present.     Tonsils: No tonsillar exudate or tonsillar abscesses. 1+ on the right. 1+ on the left.  Cardiovascular:     Rate and Rhythm: Normal rate and regular rhythm.  Pulmonary:     Effort: Pulmonary effort is normal.     Breath sounds: Normal breath sounds.  Musculoskeletal:        General: Normal range of motion.  Neurological:     Mental Status: She is alert.  Psychiatric:        Mood and Affect: Mood normal.      UC Treatments / Results  Labs (all labs ordered are listed, but  only abnormal results are displayed) Labs Reviewed  POCT RAPID STREP A (OFFICE) - Normal    EKG   Radiology No results found.  Procedures Procedures (including critical care time)  Medications Ordered in UC Medications - No data to display  Initial Impression / Assessment and Plan / UC Course  I have reviewed the triage vital signs and the nursing notes.  Pertinent labs & imaging results that were available during my care of the patient were reviewed by me and considered in my medical decision making (see chart for details).     Sore throat- Strep test was negative. This is most likely viral or allergy related Can do zyrtec, Flonase  for symptoms.  Follow up as needed  Final Clinical Impressions(s) / UC Diagnoses   Final diagnoses:  Sore throat     Discharge Instructions      Strep test was negative. This is most likely viral or allergy related Can do zyrtec, Flonase  for symptoms.  Follow up as needed      ED Prescriptions   None    PDMP not reviewed this encounter.     [1]  Social History Tobacco Use   Smoking status: Never    Passive exposure: Current   Smokeless tobacco: Never  Vaping Use   Vaping status: Never Used  Substance Use Topics   Alcohol use: No   Drug use: Never     Adah Wilbert LABOR, FNP 06/03/24 1355  "

## 2024-06-03 NOTE — Discharge Instructions (Addendum)
 Strep test was negative. This is most likely viral or allergy related Can do zyrtec, Flonase  for symptoms.  Follow up as needed

## 2024-06-03 NOTE — ED Triage Notes (Signed)
 Pt c/o sore throat, chills, and body aches for the last 3 days. States she gets strep a lot throughout the year. She has taken allergy meds and tylenol  with no relief.

## 2024-06-28 ENCOUNTER — Encounter (HOSPITAL_COMMUNITY)

## 2024-07-02 ENCOUNTER — Ambulatory Visit (HOSPITAL_COMMUNITY): Admit: 2024-07-02 | Admitting: General Surgery
